# Patient Record
Sex: Male | Born: 1950 | Race: White | Hispanic: No | Marital: Single | Smoking: Current every day smoker
Health system: Southern US, Community
[De-identification: ages and names within clinical notes are randomized; demographics above are authoritative.]

## PROBLEM LIST (undated history)

## (undated) DIAGNOSIS — I10 Essential (primary) hypertension: Secondary | ICD-10-CM

## (undated) DIAGNOSIS — K219 Gastro-esophageal reflux disease without esophagitis: Secondary | ICD-10-CM

## (undated) DIAGNOSIS — Z9889 Other specified postprocedural states: Secondary | ICD-10-CM

## (undated) DIAGNOSIS — M1990 Unspecified osteoarthritis, unspecified site: Secondary | ICD-10-CM

## (undated) DIAGNOSIS — R112 Nausea with vomiting, unspecified: Secondary | ICD-10-CM

## (undated) HISTORY — PX: OTHER SURGICAL HISTORY: SHX169

## (undated) HISTORY — PX: JOINT REPLACEMENT: SHX530

---

## 2004-10-09 ENCOUNTER — Emergency Department: Payer: Self-pay | Admitting: Emergency Medicine

## 2004-11-06 ENCOUNTER — Encounter: Payer: Self-pay | Admitting: Physician Assistant

## 2004-11-08 ENCOUNTER — Ambulatory Visit: Payer: Self-pay | Admitting: Physician Assistant

## 2004-11-09 ENCOUNTER — Encounter: Payer: Self-pay | Admitting: Physician Assistant

## 2004-12-09 ENCOUNTER — Encounter: Payer: Self-pay | Admitting: Physician Assistant

## 2004-12-17 ENCOUNTER — Ambulatory Visit: Payer: Self-pay | Admitting: Orthopaedic Surgery

## 2005-01-09 ENCOUNTER — Encounter: Payer: Self-pay | Admitting: Physician Assistant

## 2005-02-08 ENCOUNTER — Encounter: Payer: Self-pay | Admitting: Physician Assistant

## 2005-03-11 ENCOUNTER — Encounter: Payer: Self-pay | Admitting: Physician Assistant

## 2005-03-25 ENCOUNTER — Ambulatory Visit: Payer: Self-pay | Admitting: Orthopaedic Surgery

## 2005-04-11 ENCOUNTER — Encounter: Payer: Self-pay | Admitting: Physician Assistant

## 2005-07-28 ENCOUNTER — Encounter: Payer: Self-pay | Admitting: Orthopedic Surgery

## 2005-08-11 ENCOUNTER — Encounter: Payer: Self-pay | Admitting: Orthopedic Surgery

## 2005-09-11 ENCOUNTER — Encounter: Payer: Self-pay | Admitting: Orthopedic Surgery

## 2005-10-09 ENCOUNTER — Encounter: Payer: Self-pay | Admitting: Orthopedic Surgery

## 2007-05-10 ENCOUNTER — Other Ambulatory Visit: Payer: Self-pay

## 2007-05-10 ENCOUNTER — Emergency Department: Payer: Self-pay | Admitting: Emergency Medicine

## 2007-09-11 ENCOUNTER — Emergency Department: Payer: Self-pay | Admitting: Emergency Medicine

## 2008-08-06 ENCOUNTER — Emergency Department: Payer: Self-pay | Admitting: Emergency Medicine

## 2008-10-11 ENCOUNTER — Emergency Department: Payer: Self-pay | Admitting: Internal Medicine

## 2009-09-04 ENCOUNTER — Emergency Department: Payer: Self-pay | Admitting: Emergency Medicine

## 2010-03-19 ENCOUNTER — Ambulatory Visit: Payer: Self-pay | Admitting: Specialist

## 2010-03-26 ENCOUNTER — Ambulatory Visit: Payer: Self-pay | Admitting: Specialist

## 2011-11-09 ENCOUNTER — Emergency Department: Payer: Self-pay | Admitting: Emergency Medicine

## 2011-11-09 LAB — BASIC METABOLIC PANEL
Anion Gap: 11 (ref 7–16)
BUN: 14 mg/dL (ref 7–18)
EGFR (Non-African Amer.): >60
Sodium: 139 mmol/L (ref 136–145)

## 2011-11-09 LAB — TROPONIN I: Troponin-I: <0.02 ng/mL

## 2011-11-09 LAB — CK TOTAL AND CKMB (NOT AT ARMC)
CK, Total: 246 U/L — ABNORMAL HIGH (ref 35–232)
CK-MB: 1.3 ng/mL (ref 0.5–3.6)

## 2011-11-09 LAB — CBC: MCH: 30.8 pg (ref 26.0–34.0)

## 2011-12-22 ENCOUNTER — Ambulatory Visit: Payer: Self-pay | Admitting: Specialist

## 2011-12-22 LAB — MRSA PCR SCREENING

## 2011-12-22 LAB — URINALYSIS, COMPLETE
Blood: NEGATIVE
Ketone: NEGATIVE
Leukocyte Esterase: NEGATIVE
Nitrite: NEGATIVE
Specific Gravity: 1.026 (ref 1.003–1.030)
Squamous Epithelial: NONE SEEN
WBC UR: 2 /HPF (ref 0–5)

## 2011-12-22 LAB — BASIC METABOLIC PANEL
Anion Gap: 8 (ref 7–16)
Co2: 27 mmol/L (ref 21–32)
Creatinine: 0.82 mg/dL (ref 0.60–1.30)
Osmolality: 285 (ref 275–301)

## 2011-12-22 LAB — CBC
HGB: 15.4 g/dL (ref 13.0–18.0)
MCHC: 34.4 g/dL (ref 32.0–36.0)
MCV: 90 fL (ref 80–100)
Platelet: 198 10*3/uL (ref 150–440)
RDW: 13.1 % (ref 11.5–14.5)

## 2011-12-31 ENCOUNTER — Inpatient Hospital Stay: Payer: Self-pay | Admitting: Specialist

## 2012-06-17 ENCOUNTER — Encounter (HOSPITAL_COMMUNITY): Payer: Self-pay

## 2012-06-21 ENCOUNTER — Encounter (HOSPITAL_COMMUNITY)
Admission: RE | Admit: 2012-06-21 | Discharge: 2012-06-21 | Disposition: A | Discharge status: Home / Self Care | Payer: Worker's Compensation | Source: Ambulatory Visit | Attending: Orthopedic Surgery | Admitting: Orthopedic Surgery

## 2012-06-21 ENCOUNTER — Encounter (HOSPITAL_COMMUNITY): Payer: Self-pay

## 2012-06-21 HISTORY — DX: Unspecified osteoarthritis, unspecified site: M19.90

## 2012-06-21 HISTORY — DX: Gastro-esophageal reflux disease without esophagitis: K21.9

## 2012-06-21 HISTORY — DX: Nausea with vomiting, unspecified: R11.2

## 2012-06-21 HISTORY — DX: Other specified postprocedural states: Z98.890

## 2012-06-21 LAB — CBC
HCT: 44.4 % (ref 39.0–52.0)
Platelets: 212 10*3/uL (ref 150–400)
RDW: 13.2 % (ref 11.5–15.5)
WBC: 7.3 10*3/uL (ref 4.0–10.5)

## 2012-06-21 LAB — SURGICAL PCR SCREEN
MRSA, PCR: NEGATIVE
Staphylococcus aureus: POSITIVE — AB

## 2012-06-21 NOTE — Patient Instructions (Signed)
20 Mathew Adams  06/21/2012   Your procedure is scheduled on: 06/24/12 130pm-230pm  Report to [facility] at 1100 AM.  Call this number if you have problems the morning of surgery: [phone]   Remember:   Do not eat food:After Midnight.  May have clear liquids:until 0700am then npo   Clear liquids include soda, tea, black coffee, apple or grape juice, broth.  Take these medicines the morning of surgery with A SIP OF WATER:    Do not wear jewelry,   Do not wear lotions, powders, or perfumes. .  . Men may shave face and neck.  Do not bring valuables to the hospital.  Contacts, dentures or bridgework may not be worn into surgery.  Leave suitcase in the car. After surgery it may be brought to your room.  For patients admitted to the hospital, checkout time is 11:00 AM the day of discharge.                SEE CHG INSTRUCTION SHEET    Please read over the following fact sheets that you were given: MRSA Information, coughing and deep breathing exericises, leg exercises

## 2012-06-24 ENCOUNTER — Encounter (HOSPITAL_COMMUNITY): Payer: Self-pay | Admitting: Anesthesiology

## 2012-06-24 ENCOUNTER — Encounter (HOSPITAL_COMMUNITY)
Admission: RE | Disposition: A | Discharge status: Home / Self Care | Payer: Self-pay | Source: Ambulatory Visit | Attending: Orthopedic Surgery

## 2012-06-24 ENCOUNTER — Encounter (HOSPITAL_COMMUNITY): Payer: Self-pay | Admitting: *Deleted

## 2012-06-24 ENCOUNTER — Observation Stay (HOSPITAL_COMMUNITY)
Admission: RE | Admit: 2012-06-24 | Discharge: 2012-06-25 | Disposition: A | Discharge status: Home / Self Care | Payer: Worker's Compensation | Source: Ambulatory Visit | Attending: Orthopedic Surgery | Admitting: Orthopedic Surgery

## 2012-06-24 ENCOUNTER — Ambulatory Visit (HOSPITAL_COMMUNITY): Payer: Worker's Compensation | Admitting: Anesthesiology

## 2012-06-24 DIAGNOSIS — F17200 Nicotine dependence, unspecified, uncomplicated: Secondary | ICD-10-CM | POA: Insufficient documentation

## 2012-06-24 DIAGNOSIS — M25569 Pain in unspecified knee: Secondary | ICD-10-CM | POA: Insufficient documentation

## 2012-06-24 DIAGNOSIS — Z9889 Other specified postprocedural states: Secondary | ICD-10-CM

## 2012-06-24 DIAGNOSIS — E663 Overweight: Secondary | ICD-10-CM

## 2012-06-24 DIAGNOSIS — Z01812 Encounter for preprocedural laboratory examination: Secondary | ICD-10-CM | POA: Insufficient documentation

## 2012-06-24 DIAGNOSIS — Z96659 Presence of unspecified artificial knee joint: Secondary | ICD-10-CM | POA: Insufficient documentation

## 2012-06-24 DIAGNOSIS — K219 Gastro-esophageal reflux disease without esophagitis: Secondary | ICD-10-CM | POA: Insufficient documentation

## 2012-06-24 DIAGNOSIS — M25669 Stiffness of unspecified knee, not elsewhere classified: Principal | ICD-10-CM | POA: Insufficient documentation

## 2012-06-24 HISTORY — PX: KNEE CLOSED REDUCTION: SHX995

## 2012-06-24 SURGERY — MANIPULATION, KNEE, CLOSED
Anesthesia: Regional | Site: Knee | Laterality: Right | Wound class: Clean

## 2012-06-24 MED ORDER — HYDROCODONE-ACETAMINOPHEN 5-325 MG PO TABS: 1.0000 | ORAL_TABLET | ORAL | Status: DC | PRN

## 2012-06-24 MED ORDER — FENTANYL CITRATE 0.05 MG/ML IJ SOLN
50.0000 ug | Freq: Once | INTRAMUSCULAR | Status: AC
  Administered 2012-06-24: 100 ug via INTRAVENOUS

## 2012-06-24 MED ORDER — PROMETHAZINE HCL 25 MG/ML IJ SOLN
12.5000 mg | INTRAMUSCULAR | Status: DC | PRN
  Administered 2012-06-24: 12.5 mg via INTRAVENOUS
  Filled 2012-06-24: qty 1

## 2012-06-24 MED ORDER — PROMETHAZINE HCL 25 MG/ML IJ SOLN: 6.2500 mg | INTRAMUSCULAR | Status: DC | PRN

## 2012-06-24 MED ORDER — PANTOPRAZOLE SODIUM 40 MG PO TBEC
40.0000 mg | DELAYED_RELEASE_TABLET | Freq: Every day | ORAL | Status: DC
  Administered 2012-06-25: 40 mg via ORAL
  Filled 2012-06-24: qty 1

## 2012-06-24 MED ORDER — SODIUM CHLORIDE 0.9 % IV SOLN
INTRAVENOUS | Status: DC
  Administered 2012-06-24 – 2012-06-25 (×2): via INTRAVENOUS
  Filled 2012-06-24 (×4): qty 1000

## 2012-06-24 MED ORDER — MIDAZOLAM HCL 2 MG/2ML IJ SOLN
INTRAMUSCULAR | Status: AC
  Filled 2012-06-24: qty 2

## 2012-06-24 MED ORDER — HYDROMORPHONE HCL PF 1 MG/ML IJ SOLN
0.5000 mg | INTRAMUSCULAR | Status: DC | PRN
  Administered 2012-06-24: 1 mg via INTRAVENOUS
  Filled 2012-06-24: qty 1

## 2012-06-24 MED ORDER — MEPERIDINE HCL 50 MG/ML IJ SOLN: 6.2500 mg | INTRAMUSCULAR | Status: DC | PRN

## 2012-06-24 MED ORDER — BISACODYL 10 MG RE SUPP: 10.0000 mg | Freq: Every day | RECTAL | Status: DC | PRN

## 2012-06-24 MED ORDER — ALUM & MAG HYDROXIDE-SIMETH 200-200-20 MG/5ML PO SUSP: 30.0000 mL | Freq: Four times a day (QID) | ORAL | Status: DC | PRN

## 2012-06-24 MED ORDER — CEFAZOLIN SODIUM-DEXTROSE 2-3 GM-% IV SOLR: 2.0000 g | INTRAVENOUS | Status: DC

## 2012-06-24 MED ORDER — LACTATED RINGERS IV SOLN
INTRAVENOUS | Status: DC
  Administered 2012-06-24: 1000 mL via INTRAVENOUS

## 2012-06-24 MED ORDER — POLYETHYLENE GLYCOL 3350 17 G PO PACK: 17.0000 g | PACK | Freq: Every day | ORAL | Status: DC | PRN

## 2012-06-24 MED ORDER — OXYCODONE HCL 5 MG PO TABS: 5.0000 mg | ORAL_TABLET | Freq: Once | ORAL | Status: DC | PRN

## 2012-06-24 MED ORDER — ACETAMINOPHEN 10 MG/ML IV SOLN
INTRAVENOUS | Status: DC | PRN
  Administered 2012-06-24: 1000 mg via INTRAVENOUS

## 2012-06-24 MED ORDER — PROMETHAZINE HCL 25 MG PO TABS: 25.0000 mg | ORAL_TABLET | Freq: Four times a day (QID) | ORAL | Status: DC | PRN

## 2012-06-24 MED ORDER — OXYCODONE HCL 5 MG/5ML PO SOLN
5.0000 mg | Freq: Once | ORAL | Status: DC | PRN
  Filled 2012-06-24: qty 5

## 2012-06-24 MED ORDER — MIDAZOLAM HCL 10 MG/2ML IJ SOLN
1.0000 mg | INTRAMUSCULAR | Status: DC | PRN
  Administered 2012-06-24: 2 mg via INTRAVENOUS

## 2012-06-24 MED ORDER — CELECOXIB 200 MG PO CAPS
200.0000 mg | ORAL_CAPSULE | Freq: Every day | ORAL | Status: DC
  Administered 2012-06-24 – 2012-06-25 (×2): 200 mg via ORAL
  Filled 2012-06-24 (×2): qty 1

## 2012-06-24 MED ORDER — METHOCARBAMOL 500 MG PO TABS: 500.0000 mg | ORAL_TABLET | Freq: Three times a day (TID) | ORAL | Status: DC | PRN

## 2012-06-24 MED ORDER — PROPOFOL 10 MG/ML IV BOLUS
INTRAVENOUS | Status: DC | PRN
  Administered 2012-06-24: 200 mg via INTRAVENOUS

## 2012-06-24 MED ORDER — METHOCARBAMOL 100 MG/ML IJ SOLN
500.0000 mg | Freq: Once | INTRAVENOUS | Status: AC
  Administered 2012-06-24: 500 mg via INTRAVENOUS
  Filled 2012-06-24 (×3): qty 5

## 2012-06-24 MED ORDER — FENTANYL CITRATE 0.05 MG/ML IJ SOLN
INTRAMUSCULAR | Status: AC
  Filled 2012-06-24: qty 2

## 2012-06-24 MED ORDER — DOCUSATE SODIUM 100 MG PO CAPS
100.0000 mg | ORAL_CAPSULE | Freq: Two times a day (BID) | ORAL | Status: DC
  Administered 2012-06-25: 100 mg via ORAL

## 2012-06-24 MED ORDER — ACETAMINOPHEN 10 MG/ML IV SOLN
INTRAVENOUS | Status: AC
  Filled 2012-06-24: qty 100

## 2012-06-24 MED ORDER — ROPIVACAINE HCL 5 MG/ML IJ SOLN
INTRAMUSCULAR | Status: AC
  Filled 2012-06-24: qty 30

## 2012-06-24 MED ORDER — ONDANSETRON HCL 4 MG PO TABS: 4.0000 mg | ORAL_TABLET | Freq: Four times a day (QID) | ORAL | Status: DC | PRN

## 2012-06-24 MED ORDER — INFLUENZA VIRUS VACC SPLIT PF IM SUSP
0.5000 mL | INTRAMUSCULAR | Status: AC
  Administered 2012-06-25: 0.5 mL via INTRAMUSCULAR
  Filled 2012-06-24: qty 0.5

## 2012-06-24 MED ORDER — ACETAMINOPHEN 10 MG/ML IV SOLN: 1000.0000 mg | Freq: Once | INTRAVENOUS | Status: DC | PRN

## 2012-06-24 MED ORDER — ZOLPIDEM TARTRATE 5 MG PO TABS: 5.0000 mg | ORAL_TABLET | Freq: Every evening | ORAL | Status: DC | PRN

## 2012-06-24 MED ORDER — HYDROMORPHONE HCL PF 1 MG/ML IJ SOLN
INTRAMUSCULAR | Status: AC
  Filled 2012-06-24: qty 1

## 2012-06-24 MED ORDER — ASPIRIN EC 325 MG PO TBEC
325.0000 mg | DELAYED_RELEASE_TABLET | Freq: Every day | ORAL | Status: DC
  Administered 2012-06-24 – 2012-06-25 (×2): 325 mg via ORAL
  Filled 2012-06-24 (×2): qty 1

## 2012-06-24 MED ORDER — ONDANSETRON HCL 4 MG/2ML IJ SOLN
4.0000 mg | Freq: Four times a day (QID) | INTRAMUSCULAR | Status: DC | PRN
  Administered 2012-06-24: 4 mg via INTRAVENOUS
  Filled 2012-06-24: qty 2

## 2012-06-24 MED ORDER — HYDROMORPHONE HCL PF 1 MG/ML IJ SOLN
0.2500 mg | INTRAMUSCULAR | Status: DC | PRN
  Administered 2012-06-24 (×4): 0.5 mg via INTRAVENOUS

## 2012-06-24 MED ORDER — PROMETHAZINE HCL 25 MG PO TABS: 25.0000 mg | ORAL_TABLET | ORAL | Status: DC | PRN

## 2012-06-24 MED ORDER — LIDOCAINE HCL (CARDIAC) 20 MG/ML IV SOLN
INTRAVENOUS | Status: DC | PRN
  Administered 2012-06-24: 50 mg via INTRAVENOUS

## 2012-06-24 SURGICAL SUPPLY — 19 items
BANDAGE ADHESIVE 1X3 (GAUZE/BANDAGES/DRESSINGS) IMPLANT
CLOTH BEACON ORANGE TIMEOUT ST (SAFETY) IMPLANT
DURAPREP 26ML APPLICATOR (WOUND CARE) IMPLANT
GLOVE BIOGEL PI IND STRL 7.5 (GLOVE) IMPLANT
GLOVE BIOGEL PI IND STRL 8 (GLOVE) IMPLANT
GLOVE BIOGEL PI INDICATOR 7.5 (GLOVE)
GLOVE BIOGEL PI INDICATOR 8 (GLOVE)
GLOVE ECLIPSE 8.0 STRL XLNG CF (GLOVE) IMPLANT
GLOVE ORTHO TXT STRL SZ7.5 (GLOVE) IMPLANT
GLOVE SURG ORTHO 8.0 STRL STRW (GLOVE) IMPLANT
GOWN BRE IMP PREV XXLGXLNG (GOWN DISPOSABLE) IMPLANT
GOWN STRL NON-REIN LRG LVL3 (GOWN DISPOSABLE) IMPLANT
MANIFOLD NEPTUNE II (INSTRUMENTS) IMPLANT
NDL SAFETY ECLIPSE 18X1.5 (NEEDLE) IMPLANT
NEEDLE HYPO 18GX1.5 SHARP (NEEDLE)
POSITIONER SURGICAL ARM (MISCELLANEOUS) IMPLANT
SPONGE GAUZE 4X4 12PLY (GAUZE/BANDAGES/DRESSINGS) IMPLANT
SYR CONTROL 10ML LL (SYRINGE) IMPLANT
TOWEL OR 17X26 10 PK STRL BLUE (TOWEL DISPOSABLE) IMPLANT

## 2012-06-24 NOTE — Addendum Note (Signed)
Addendum  created 06/24/12 1537 by Gaylan Gerold, MD   Modules edited:Charting, Inpatient Notes

## 2012-06-24 NOTE — H&P (Signed)
TOTAL KNEE ADMISSION H&P  Patient is being admitted for observation for right total knee manipulation under anestheisa  Subjective:  Chief Complaint:right knee pain about 6 months after right TKR.  HPI: Mathew Adams, 61 y.o. male, has a history of pain and functional disability in the right knee about 6 months after his total knee replacement with associated significant loss of motion.  He has failed therapy but has not had prior attempt at manipulation.  He has had persistent pain associated with this knee.  Work up for infection and clinical presentation were negative for infection.  After lengthy discussion in the office about next steps and options given his current situation I felt that at least an attempt at manipulation would be appropriate as other options include continued observation which he was not interested in versus revision TKR which I do not feel would be appropriate at this time due to significant pain and stiffness and early scar formation.  Patient currently rates pain in the right knee(s) at 7-8 out of 10 with activity. Patient has night pain, worsening of pain with activity and weight bearing, pain that interferes with activities of daily living and pain with passive range of motion.    There are no active problems to display for this patient.  Past Medical History  Diagnosis Date  . PONV (postoperative nausea and vomiting)   . GERD (gastroesophageal reflux disease)   . Arthritis     Past Surgical History  Procedure Date  . Left rotator cuff surgery    . Joint replacement     right knee replacement  . Left knee arthroscopy      Prescriptions prior to admission  Medication Sig Dispense Refill  . celecoxib (CELEBREX) 200 MG capsule Take 200 mg by mouth daily.      . lansoprazole (PREVACID) 30 MG capsule Take 30 mg by mouth daily.       No Known Allergies  History  Substance Use Topics  . Smoking status: Current Every Day Smoker -- 0.5 packs/day for 45 years  .  Smokeless tobacco: Never Used  . Alcohol Use: No    History reviewed. No pertinent family history.   Review of Systems  Constitutional: Negative for fever, chills and weight loss.  Eyes: Negative for blurred vision and double vision.  Respiratory: Negative for hemoptysis, sputum production, shortness of breath and wheezing.   Cardiovascular: Negative.  Negative for chest pain.  Gastrointestinal: Negative for nausea and vomiting.  Genitourinary: Negative for dysuria.  Musculoskeletal: Positive for joint pain.  Skin: Negative.   Neurological: Negative.   Psychiatric/Behavioral: Negative.     Objective:  Physical Exam  Constitutional: He appears well-developed and well-nourished.  Eyes: Pupils are equal, round, and reactive to light.  Neck: Normal range of motion. Neck supple.  Cardiovascular: Normal rate.   No murmur heard. Respiratory: Breath sounds normal.  GI: Soft.  Musculoskeletal: He exhibits tenderness.       Right knee: He exhibits decreased range of motion and bony tenderness. He exhibits no swelling, no effusion, no ecchymosis, no laceration and normal alignment. tenderness found.    Vital signs in last 24 hours: Temp:  [97 F (36.1 C)] 97 F (36.1 C) (11/14 0933) Pulse Rate:  [51] 51  (11/14 0933) Resp:  [18] 18  (11/14 0933) BP: (146)/(65) 146/65 mmHg (11/14 0933) SpO2:  [100 %] 100 % (11/14 0933)  Labs:   There is no height or weight on file to calculate BMI.   Imaging  Review X-rays from office revealed appropriately sized components and those that were well fixed, no signs of early periprosthetic complicating features   Assessment/Plan:  Persistently painful and stiff right TKR   Plan: After lengthy review in the office the plan at this point is to attempt a manipulation under anesthesia, admit for observation, start CPM here and continue at home +/- JAS splint for extension and return to outpatient therapy as soon as possible. Risks including  fracture, failure to improve motion persistent painful limited range of motion reviewed Consent obtained

## 2012-06-24 NOTE — Brief Op Note (Signed)
06/24/2012  12:44 PM  PATIENT:  Mathew Adams  61 y.o. male  PRE-OPERATIVE DIAGNOSIS:  right total knee arthrofibrosis, painful stiff Right TKR  POST-OPERATIVE DIAGNOSIS:  Right total knee arthrofibrosis  PROCEDURE:  Procedure(s) (LRB) with comments: CLOSED MANIPULATION KNEE (Right)  SURGEON:  Surgeon(s) and Role:    * Shelda Pal, MD - Primary  PHYSICIAN ASSISTANT: None   ANESTHESIA:   regional FNB and general  EBL:     BLOOD ADMINISTERED:none  DRAINS: none   LOCAL MEDICATIONS USED:  NONE  SPECIMEN:  No Specimen  DISPOSITION OF SPECIMEN:  N/A  COUNTS:  NO closed manipulation, not open procedure  TOURNIQUET:  * No tourniquets in log *  DICTATION: .Other Dictation: Dictation Number [id number]  PLAN OF CARE: Admit for overnight observation  PATIENT DISPOSITION:  PACU - hemodynamically stable.   Delay start of Pharmacological VTE agent (>24hrs) due to surgical blood loss or risk of bleeding: not applicable

## 2012-06-24 NOTE — Anesthesia Procedure Notes (Addendum)
Anesthesia Regional Block:  Femoral nerve block  Pre-Anesthetic Checklist: ,, timeout performed, Correct Patient, Correct Site, Correct Laterality, Correct Procedure, Correct Position, site marked, Risks and benefits discussed,  Surgical consent,  Pre-op evaluation,  At surgeon's request and post-op pain management  Laterality: Right  Prep: chloraprep       Needles:  Injection technique: Single-shot  Needle Type: Stimiplex      Needle Gauge: 18 and 18 G    Additional Needles:  Procedures: ultrasound guided (picture in chart) and nerve stimulator  Motor weakness within 10 minutes. Femoral nerve block  Nerve Stimulator or Paresthesia:  Response: Quad, 0.5 mA,   Additional Responses:   Narrative:  Start time: 06/24/2012 12:16 PM End time: 06/24/2012 12:25 PM Injection made incrementally with aspirations every 5 mL.  Performed by: Personally  Anesthesiologist: Renold Don

## 2012-06-24 NOTE — Addendum Note (Signed)
Addendum  created 06/24/12 1553 by Gaylan Gerold, MD   Modules edited:Charting, Inpatient Notes

## 2012-06-24 NOTE — Anesthesia Postprocedure Evaluation (Signed)
Anesthesia Post Note  Patient: Mathew Adams  Procedure(s) Performed: Procedure(s) (LRB): CLOSED MANIPULATION KNEE (Right)  Anesthesia type: General  Patient location: PACU  Post pain: Pain level controlled  Post assessment: Post-op Vital signs reviewed  Last Vitals: BP 157/70  Pulse 47  Temp 36.4 C (Oral)  Resp 18  SpO2 99%  Post vital signs: Reviewed  Level of consciousness: sedated  Complications: No apparent anesthesia complications

## 2012-06-24 NOTE — Anesthesia Preprocedure Evaluation (Addendum)
Anesthesia Evaluation  Patient identified by MRN, date of birth, ID band Patient awake    Reviewed: Allergy & Precautions, H&P , NPO status , Patient's Chart, lab work & pertinent test results  History of Anesthesia Complications (+) PONV  Airway Mallampati: I TM Distance: >3 FB Neck ROM: Full    Dental  (+) Dental Advisory Given, Edentulous Upper and Edentulous Lower   Pulmonary  breath sounds clear to auscultation  Pulmonary exam normal       Cardiovascular Rhythm:Regular Rate:Normal     Neuro/Psych    GI/Hepatic GERD-  ,  Endo/Other    Renal/GU      Musculoskeletal   Abdominal   Peds  Hematology   Anesthesia Other Findings   Reproductive/Obstetrics                          Anesthesia Physical Anesthesia Plan  ASA: II  Anesthesia Plan: General and Regional   Post-op Pain Management:    Induction: Intravenous  Airway Management Planned: Mask  Additional Equipment:   Intra-op Plan:   Post-operative Plan:   Informed Consent: I have reviewed the patients History and Physical, chart, labs and discussed the procedure including the risks, benefits and alternatives for the proposed anesthesia with the patient or authorized representative who has indicated his/her understanding and acceptance.   Dental advisory given  Plan Discussed with: CRNA  Anesthesia Plan Comments:        Anesthesia Quick Evaluation

## 2012-06-24 NOTE — Progress Notes (Signed)
Orthopedic Tech Progress Note Patient Details:  Mathew Adams 07-Mar-1951 [id number]  CPM Right Knee CPM Right Knee: On Right Knee Flexion (Degrees): 60  Right Knee Extension (Degrees): 0  Additional Comments: trapeze bar   Cammer, Mickie Bail 06/24/2012, 3:31 PM

## 2012-06-24 NOTE — Transfer of Care (Signed)
Immediate Anesthesia Transfer of Care Note  Patient: Mathew Adams  Procedure(s) Performed: Procedure(s) (LRB) with comments: CLOSED MANIPULATION KNEE (Right)  Patient Location: PACU  Anesthesia Type:General  Level of Consciousness: awake, alert  and oriented  Airway & Oxygen Therapy: Patient Spontanous Breathing and Patient connected to face mask oxygen  Post-op Assessment: Report given to PACU RN and Post -op Vital signs reviewed and stable  Post vital signs: Reviewed and stable  Complications: No apparent anesthesia complications 

## 2012-06-25 ENCOUNTER — Encounter (HOSPITAL_COMMUNITY): Payer: Self-pay | Admitting: Orthopedic Surgery

## 2012-06-25 DIAGNOSIS — Z9889 Other specified postprocedural states: Secondary | ICD-10-CM

## 2012-06-25 DIAGNOSIS — E663 Overweight: Secondary | ICD-10-CM

## 2012-06-25 MED ORDER — PROMETHAZINE HCL 25 MG PO TABS: 25.0000 mg | ORAL_TABLET | ORAL | Status: DC | PRN

## 2012-06-25 MED ORDER — POLYETHYLENE GLYCOL 3350 17 G PO PACK: 17.0000 g | PACK | Freq: Every day | ORAL | Status: DC | PRN

## 2012-06-25 MED ORDER — PROMETHAZINE HCL 25 MG/ML IJ SOLN: 12.5000 mg | INTRAMUSCULAR | Status: DC | PRN

## 2012-06-25 MED ORDER — ASPIRIN 325 MG PO TBEC: 325.0000 mg | DELAYED_RELEASE_TABLET | Freq: Every day | ORAL | Status: DC

## 2012-06-25 MED ORDER — HYDROCODONE-ACETAMINOPHEN 5-325 MG PO TABS: 1.0000 | ORAL_TABLET | ORAL | Status: DC | PRN

## 2012-06-25 MED ORDER — DSS 100 MG PO CAPS: 100.0000 mg | ORAL_CAPSULE | Freq: Two times a day (BID) | ORAL | Status: DC

## 2012-06-25 MED ORDER — METHOCARBAMOL 500 MG PO TABS: 500.0000 mg | ORAL_TABLET | Freq: Three times a day (TID) | ORAL | Status: DC | PRN

## 2012-06-25 NOTE — Progress Notes (Signed)
1310  Patient d/c to home, workers comp Sports coach to arrange hh pt.  Jas splint rep saw patient has info needed and will deliver splint to home. Patient has cpm at home set up and ready to use.  Sharrell Ku RN

## 2012-06-25 NOTE — Evaluation (Addendum)
Physical Therapy One Time Evaluation Patient Details Name: Mathew Adams MRN: [id number] DOB: 22-Nov-1950 Today's Date: 06/25/2012 Time: 4098-1191 PT Time Calculation (min): 24 min  PT Assessment / Plan / Recommendation Clinical Impression  Pt s/p R knee closed manipulation.  Pt seen for ambulation with RW and performing exercises to strengthen and improve ROM of R LE.  Pt doing well with RW and given handout for exercises.  Pt to receive HHPT to continue therapy and to d/c home today.    PT Assessment  All further PT needs can be met in the next venue of care    Follow Up Recommendations  Home health PT    Does the patient have the potential to tolerate intense rehabilitation      Barriers to Discharge        Equipment Recommendations  None recommended by PT    Recommendations for Other Services     Frequency      Precautions / Restrictions Precautions Precautions: Knee Required Braces or Orthoses: Knee Immobilizer - Right Knee Immobilizer - Right: Discontinue once straight leg raise with < 10 degree lag Restrictions RLE Weight Bearing: Weight bearing as tolerated   Pertinent Vitals/Pain 5/10 R knee pain after exercises, ice applied, pt declined notifying RN for meds      Mobility  Bed Mobility Bed Mobility: Supine to Sit;Sit to Supine Supine to Sit: 6: Modified independent (Device/Increase time) Sit to Supine: 6: Modified independent (Device/Increase time) Transfers Transfers: Sit to Stand;Stand to Sit Sit to Stand: 6: Modified independent (Device/Increase time) Stand to Sit: 6: Modified independent (Device/Increase time) Ambulation/Gait Ambulation/Gait Assistance: 6: Modified independent (Device/Increase time) Ambulation Distance (Feet): 120 Feet Assistive device: Rolling Gathers Ambulation/Gait Assistance Details: pt required verbal cues for safe RW distance but otherwise does well with RW Gait Pattern: Step-through pattern;Antalgic    Shoulder  Instructions     Exercises Total Joint Exercises Ankle Circles/Pumps: AROM;Both;20 reps Quad Sets: AROM;Right;20 reps Short Arc Quad: AROM;Right;20 reps Heel Slides: AAROM;Right;20 reps;Supine;Seated Hip ABduction/ADduction: AROM;Right;20 reps Straight Leg Raises: AAROM;Right;15 reps   PT Diagnosis: Abnormality of gait  PT Problem List: Decreased strength;Decreased range of motion;Pain PT Treatment Interventions:     PT Goals    Visit Information  Last PT Received On: 06/25/12 Assistance Needed: +1    Subjective Data  Subjective: I'd rather not take pain medicine if I can help it.   Prior Functioning  Home Living Lives With: Spouse Type of Home: House Home Access: Level entry Home Layout: One level Home Adaptive Equipment: Tapper - rolling Prior Function Level of Independence: Independent Communication Communication: No difficulties    Cognition  Overall Cognitive Status: Appears within functional limits for tasks assessed/performed Arousal/Alertness: Awake/alert Orientation Level: Appears intact for tasks assessed Behavior During Session: [facility] for tasks performed    Extremity/Trunk Assessment Right Upper Extremity Assessment RUE ROM/Strength/Tone: [facility] for tasks assessed Left Upper Extremity Assessment LUE ROM/Strength/Tone: WFL for tasks assessed Right Lower Extremity Assessment RLE ROM/Strength/Tone: Deficits RLE ROM/Strength/Tone Deficits: knee AAROM approx -10-55*, lag with SLR so KI applied Left Lower Extremity Assessment LLE ROM/Strength/Tone: WFL for tasks assessed   Balance    End of Session PT - End of Session Equipment Utilized During Treatment: Right knee immobilizer Activity Tolerance: Patient tolerated treatment well Patient left: in bed;with call bell/phone within reach;with family/visitor present  GP Functional Assessment Tool Used: clinical judgement Functional Limitation: Mobility: Walking and moving around Mobility: Walking and Moving Around  Current Status (Y7829): 0 percent impaired, limited or restricted  Mobility: Walking and Moving Around Goal Status [phone]): 0 percent impaired, limited or restricted Mobility: Walking and Moving Around Discharge Status [phone]): 0 percent impaired, limited or restricted   Amyrie Illingworth,KATHrine E 06/25/2012, 12:36 PM Pager: [phone]

## 2012-06-25 NOTE — Progress Notes (Signed)
Orthopedic Tech Progress Note Patient Details:  Mathew Adams [birth year] [id number]  Patient ID: Mathew Adams, male   DOB: 1951/06/06, 61 y.o.   MRN: [id number]   [name] 06/25/2012, 8:34 AM CALLED [employer] FOR RIGHT LEG JAZZ SPLINT.

## 2012-06-25 NOTE — Care Management Note (Signed)
    Page 1 of 2   06/25/2012     12:05:08 PM   CARE MANAGEMENT NOTE 06/25/2012  Patient:  Mathew Adams, Mathew Adams   Account Number:  [id number]  Date Initiated:  06/25/2012  Documentation initiated by:  Colleen Can  Subjective/Objective Assessment:   DX RT TOTAL KNEE ARTHROFIBROSIS: manipulation of rt knee under anesthesia    Worker's Comp-claim#[id number]  [place]  Adjuster-Gerald Grass-[phone]     Action/Plan:   CM spoke with patient and friend. Plans are that patient will return to [facility] where he will have caregiver. States he alrewady has cane and [place]. States CPM was delivered to his home prior to his hosp admission   Anticipated DC Date:  06/25/2012   Anticipated DC Plan:  HOME W HOME HEALTH SERVICES  In-house referral  NA      DC Planning Services  CM consult      [facility] Choice  HOME HEALTH   Choice offered to / List presented to:  C-1 Patient   DME arranged  NA      DME agency  NA     HH arranged  HH-2 PT      HH agency  OTHER - SEE NOTE   Status of service:  In process, will continue to follow Medicare Important Message given?   (If response is "NO", the following Medicare IM given date fields will be blank) Date Medicare IM given:   Date Additional Medicare IM given:    Discharge Disposition:    Per UR Regulation:  Reviewed for med. necessity/level of care/duration of stay  If discussed at Long Length of Stay Meetings, dates discussed:    Comments:  06/25/2012 Colleen Can BSN RN CCM [phone]  [employer] worker's comp case manager-Sandra-[phone]; she requested that [facility] orders, face sheet, op note, h&p, discharge instructions be faxed to [phone]. Info faxed with confirmation. Worker's comp Sports coach states she will advise patient what agency will be providing HHPT when it goes through agency that sets up hh services.

## 2012-06-25 NOTE — Op Note (Signed)
NAMEAWAIS, COBARRUBIAS NO.:  [id number]  MEDICAL RECORD NO.:  [id number]  LOCATION:  1619                         FACILITY:  [facility]  PHYSICIAN:  Madlyn Frankel. Charlann Boxer, M.D.  DATE OF BIRTH:  1951-02-13  DATE OF PROCEDURE:  06/24/2012 DATE OF DISCHARGE:                              OPERATIVE REPORT   PREOPERATIVE DIAGNOSIS:  Status post right total knee replacement with persistent pain and stiffness consistent with arthrofibrosis.  POSTOPERATIVE DIAGNOSIS:  Status post right total knee replacement with persistent pain and stiffness consistent with arthrofibrosis.  FINDINGS:  The patient's preoperative range of motion was lacking 5-10 degrees of full extension and flexion only to about 60 degrees.  PROCEDURE:  Closed manipulation of right knee under anesthesia.  ANESTHESIA:  Preoperative regional femoral nerve block plus general anesthetic.  SPECIMENS:  None.  COMPLICATIONS:  None.  INDICATIONS FOR PROCEDURE:  Mr. Imel is a 61 year old gentleman with a history of a total knee replacement about 6 months ago.  He presented to the office for a second opinion evaluation with persistent pain and stiffness.  I had a lengthy discussion about options available to try to manage his knee and did not feel that an open procedure was indicated based on the time away from surgery and his persistent scarring.  I had suggested a manipulation under anesthesia with the attempt to try to salvage the joint without having to open them up.  Despite the fact that it was close to 6 months out in a limited success and literature for manipulations this far out.  We discussed the risks of recurrence of pain, stiffness, the postoperative course and expectations and the risk of fracture or other complicating features.  Consent was obtained for benefit of motion and pain relief.  PROCEDURE IN DETAIL:  The patient was brought to the operative theater. Once adequate anesthesia was  established, he was positioned on the stretcher, a time-out was performed identifying the patient, planned procedure, and extremity.  The examination under anesthesia initially found this range of motion we lacked 5 to 10 degrees of full extension in it and with his hip flexed only flex passively to about 60 degrees.  Initially, I tried to held his leg out to extension.  It did not get a significant amount of motion with his knee in extension.  I then flexed his hip up and then applied pressure across the proximal tibia and with this found was able to lyse significant amount of adhesions.  A of a constant progressive flexion stress across proximal tibia.  I was able to get him flexed to at least 110 fairly easily and with pressure to 120.  I then brought his leg out to extension again and with his foot on to my shoulder try to get little down with pressure and maybe got a little bit of stretching of the posterior structures to about 5 degrees.  This represented a significant improvement in his range of motion again from a motion of 10 to 60 degree arc to a 5 to 110 to 120 degree arc.  Following these procedures the case concluded.  The patient was then extubated and brought to the recovery room  in stable condition.  He will stay overnight to be placed on a CPM.  We will arrange for home CPM and home JAS splint to work on extension and flexion, as well as immediate physical therapy outpatient.     Madlyn Frankel Charlann Boxer, M.D.     MDO/MEDQ  D:  06/24/2012  T:  06/25/2012  Job:  [id number]

## 2012-06-25 NOTE — Progress Notes (Signed)
Orthopedic Tech Progress Note Patient Details:  Mathew Adams [birth year] [id number]  Ortho Devices Type of Ortho Device: Knee Immobilizer Ortho Device/Splint Location: RIGHT KNEE IMMOBILIZER Ortho Device/Splint Interventions: Application   [name] 06/25/2012, 9:20 AM

## 2012-06-25 NOTE — Progress Notes (Signed)
   Subjective: 1 Day Post-Op Procedure(s) (LRB): CLOSED MANIPULATION KNEE (Right)   Patient reports pain as mild, pain well controlled. States that he is still unable to do a straight leg raise. Ready to be discharged home.  Objective:   VITALS:   Filed Vitals:   06/25/12 0517  BP: 131/69  Pulse: 57  Temp: 97.8 F (36.6 C)  Resp: 14    Dorsiflexion/Plantar flexion intact No cellulitis present Compartment soft  LABS No new labs   Assessment/Plan: 1 Day Post-Op Procedure(s) (LRB): CLOSED MANIPULATION KNEE (Right) Maintain knee immobilizer until he can do a straight leg raise PT x 1 ordered to go over exercises and ambulation  Up with therapy Discharge home with home health Follow up in 2 weeks at [facility]. Follow up with OLIN,William Schake D in 2 weeks.  Contact information:  [facility] [address] [place] [address] [phone]     Overweight (BMI 25-29.9) Estimated Body mass index is 29.42 kg/(m^2) as calculated from the following:   Height as of this encounter: 6\' 1" (1.854 m).   Weight as of this encounter: 223 lb(101.152 kg). Patient also counseled that weight may inhibit the healing process Patient counseled that losing weight will help with future health issues       Anastasio Auerbach. Araceli Coufal   PAC  06/25/2012, 8:51 AM

## 2012-06-30 NOTE — Discharge Summary (Signed)
Physician Discharge Summary  Patient ID: Mathew Adams MRN: [id number] DOB/AGE: August 23, 1950 61 y.o.  Admit date: 06/24/2012 Discharge date: 06/25/2012   Procedures:  Procedure(s) (LRB): CLOSED MANIPULATION KNEE (Right)  Attending Physician:  Dr. Durene Romans   Admission Diagnoses:   Right knee pain about 6 months after right TKR  Discharge Diagnoses:  Principal Problem:  *S/P right knee manipulation Active Problems:  Overweight (BMI 25.0-29.9) PONV (postoperative nausea and vomiting)   GERD (gastroesophageal reflux disease)   Arthritis   HPI:  Mathew Adams, 61 y.o. male, has a history of pain and functional disability in the right knee about 6 months after his total knee replacement with associated significant loss of motion. He has failed therapy but has not had prior attempt at manipulation. He has had persistent pain associated with this knee. Work up for infection and clinical presentation were negative for infection. After lengthy discussion in the office about next steps and options given his current situation I felt that at least an attempt at manipulation would be appropriate as other options include continued observation which he was not interested in versus revision TKR which I do not feel would be appropriate at this time due to significant pain and stiffness and early scar formation. Patient currently rates pain in the right knee(s) at 7-8 out of 10 with activity. Patient has night pain, worsening of pain with activity and weight bearing, pain that interferes with activities of daily living and pain with passive range of motion.   PCP: No primary provider on file.   Discharged Condition: good  Hospital Course:  Patient underwent the above stated procedure on 06/24/2012. Patient tolerated the procedure well and brought to the recovery room in good condition and subsequently to the floor.  POD #1 BP: 131/69 ; Pulse: 57 ; Temp: 97.8 F (36.6 C) ; Resp: 14 Pt's  foley was removed, as well as the hemovac drain removed. IV was changed to a saline lock. Patient reports pain as mild, pain well controlled. States that he is still unable to do a straight leg raise. Ready to be discharged home. Neurovascular intact, dorsiflexion/plantar flexion intact, incision: dressing C/D/I, no cellulitis present and compartment soft.   LABS   No labs  Discharge Exam: General appearance: alert, cooperative and no distress Extremities: Homans sign is negative, no sign of DVT, no edema, redness or tenderness in the calves or thighs and no ulcers, gangrene or trophic changes  Disposition:  Home or Self Care with follow up in 2 weeks   Follow-up Information    Follow up with Shelda Pal, MD. Schedule an appointment as soon as possible for a visit in 2 weeks.   Contact information:   [address] [place] [address] [phone]          Discharge Orders    Future Orders Please Complete By Expires   Diet - low sodium heart healthy      Call MD / Call 911      Comments:   If you experience chest pain or shortness of breath, CALL 911 and be transported to the hospital emergency room.  If you develope a fever above 101 F, pus (white drainage) or increased drainage or redness at the wound, or calf pain, call your surgeon's office.   Discharge instructions      Comments:   Follow up in 2 weeks at [facility]. Call with any questions or concerns.  CPM machine 4-6 hours a day,  0-60 degrees, increasing 10 degrees daily as tolerated.   Constipation Prevention      Comments:   Drink plenty of fluids.  Prune juice may be helpful.  You may use a stool softener, such as Colace (over the counter) 100 mg twice a day.  Use MiraLax (over the counter) for constipation as needed.   Increase activity slowly as tolerated         Discharge Medication List as of 06/25/2012 12:45 PM    START taking these medications   Details  aspirin EC 325 MG EC  tablet Take 1 tablet (325 mg total) by mouth daily., Starting 06/25/2012, Until Discontinued, No Print    docusate sodium 100 MG CAPS Take 100 mg by mouth 2 (two) times daily., Starting 06/25/2012, Until Discontinued, No Print    HYDROcodone-acetaminophen (NORCO/VICODIN) 5-325 MG per tablet Take 1-2 tablets by mouth every 4 (four) hours as needed for pain., Starting 06/25/2012, Until Discontinued, Print    methocarbamol (ROBAXIN) 500 MG tablet Take 1 tablet (500 mg total) by mouth every 8 (eight) hours as needed (muscle spasms)., Starting 06/25/2012, Until Discontinued, Print    polyethylene glycol (MIRALAX / GLYCOLAX) packet Take 17 g by mouth daily as needed., Starting 06/25/2012, Until Discontinued, No Print      CONTINUE these medications which have NOT CHANGED   Details  celecoxib (CELEBREX) 200 MG capsule Take 200 mg by mouth daily., Until Discontinued, Historical Med    lansoprazole (PREVACID) 30 MG capsule Take 30 mg by mouth daily., Until Discontinued, Historical Med         Signed: [name]. Mathew Adams   PAC  06/30/2012, 2:39 PM

## 2012-08-31 ENCOUNTER — Encounter (HOSPITAL_COMMUNITY): Payer: Self-pay | Admitting: Pharmacy Technician

## 2012-08-31 NOTE — Progress Notes (Signed)
Preop appointment for surgery on 1/24 at 1000am Need orders in EPIC.  Thanks.

## 2012-09-02 ENCOUNTER — Other Ambulatory Visit (HOSPITAL_COMMUNITY): Payer: Self-pay | Admitting: Orthopedic Surgery

## 2012-09-02 NOTE — Patient Instructions (Addendum)
20 VRISHANK MOSTER  09/02/2012   Your procedure is scheduled on: 09-13-2012  Report to [facility] at 1150 pm  Call this number if you have problems the morning of surgery [phone]   Remember:   Do not eat food :After Midnight.  Clear liquids midnight until 0820 day of surgery, then nothing by mouth   Take these medicines the morning of surgery with A SIP OF WATER: HYDROCODONE IF NEEDED, PREVACID                             SEE [place] PREPARING FOR SURGERY SHEET   Do not wear jewelry, make-up or nail polish.  Do not wear lotions, powders, or perfumes. You may wear deodorant.   Men may shave face and neck.  Do not bring valuables to the hospital.  Contacts, dentures or bridgework may not be worn into surgery.  Leave suitcase in the car. After surgery it may be brought to your room.  For patients admitted to the hospital, checkout time is 11:00 AM the day of discharge.   Patients discharged the day of surgery will not be allowed to drive home.  Name and phone number of your driver:  Special Instructions: N/A   Please read over the following fact sheets that you were given: MRSA Information.  Call Cain Sieve RN pre op nurse if needed [phone]    FAILURE TO FOLLOW THESE INSTRUCTIONS MAY RESULT IN THE CANCELLATION OF YOUR SURGERY. PATIENT SIGNATURE___________________________________________

## 2012-09-03 ENCOUNTER — Encounter (HOSPITAL_COMMUNITY)
Admission: RE | Admit: 2012-09-03 | Discharge: 2012-09-03 | Disposition: A | Discharge status: Home / Self Care | Payer: Worker's Compensation | Source: Ambulatory Visit | Attending: Orthopedic Surgery | Admitting: Orthopedic Surgery

## 2012-09-03 ENCOUNTER — Encounter (HOSPITAL_COMMUNITY): Payer: Self-pay

## 2012-09-03 LAB — CBC
Hemoglobin: 15.3 g/dL (ref 13.0–17.0)
MCH: 30.8 pg (ref 26.0–34.0)
MCHC: 34.6 g/dL (ref 30.0–36.0)
MCV: 88.9 fL (ref 78.0–100.0)
Platelets: 234 10*3/uL (ref 150–400)
RBC: 4.97 MIL/uL (ref 4.22–5.81)
RDW: 12.8 % (ref 11.5–15.5)

## 2012-09-03 LAB — SURGICAL PCR SCREEN: Staphylococcus aureus: NEGATIVE

## 2012-09-07 NOTE — Progress Notes (Signed)
Spoke with tp by phone aware surgery time changed npo after midnight arrive 0925 am 09-13-12 [facility] short stay surgery time is 1225.

## 2012-09-12 MED ORDER — CEFAZOLIN SODIUM-DEXTROSE 2-3 GM-% IV SOLR: 2.0000 g | INTRAVENOUS | Status: DC

## 2012-09-13 ENCOUNTER — Encounter (HOSPITAL_COMMUNITY): Payer: Self-pay | Admitting: *Deleted

## 2012-09-13 ENCOUNTER — Encounter (HOSPITAL_COMMUNITY): Payer: Self-pay | Admitting: Anesthesiology

## 2012-09-13 ENCOUNTER — Encounter (HOSPITAL_COMMUNITY)
Admission: RE | Disposition: A | Discharge status: Home / Self Care | Payer: Self-pay | Source: Ambulatory Visit | Attending: Orthopedic Surgery

## 2012-09-13 ENCOUNTER — Ambulatory Visit (HOSPITAL_COMMUNITY): Payer: Worker's Compensation | Admitting: Anesthesiology

## 2012-09-13 ENCOUNTER — Ambulatory Visit (HOSPITAL_COMMUNITY)
Admission: RE | Admit: 2012-09-13 | Discharge: 2012-09-13 | Disposition: A | Discharge status: Home / Self Care | Payer: Worker's Compensation | Source: Ambulatory Visit | Attending: Orthopedic Surgery | Admitting: Orthopedic Surgery

## 2012-09-13 DIAGNOSIS — M25569 Pain in unspecified knee: Secondary | ICD-10-CM | POA: Insufficient documentation

## 2012-09-13 DIAGNOSIS — Z9889 Other specified postprocedural states: Secondary | ICD-10-CM

## 2012-09-13 DIAGNOSIS — Z7982 Long term (current) use of aspirin: Secondary | ICD-10-CM | POA: Insufficient documentation

## 2012-09-13 DIAGNOSIS — T8489XA Other specified complication of internal orthopedic prosthetic devices, implants and grafts, initial encounter: Secondary | ICD-10-CM | POA: Insufficient documentation

## 2012-09-13 DIAGNOSIS — F17200 Nicotine dependence, unspecified, uncomplicated: Secondary | ICD-10-CM | POA: Insufficient documentation

## 2012-09-13 DIAGNOSIS — Z79899 Other long term (current) drug therapy: Secondary | ICD-10-CM | POA: Insufficient documentation

## 2012-09-13 DIAGNOSIS — Z96659 Presence of unspecified artificial knee joint: Secondary | ICD-10-CM | POA: Insufficient documentation

## 2012-09-13 DIAGNOSIS — Z01812 Encounter for preprocedural laboratory examination: Secondary | ICD-10-CM | POA: Insufficient documentation

## 2012-09-13 DIAGNOSIS — K219 Gastro-esophageal reflux disease without esophagitis: Secondary | ICD-10-CM | POA: Insufficient documentation

## 2012-09-13 DIAGNOSIS — Y831 Surgical operation with implant of artificial internal device as the cause of abnormal reaction of the patient, or of later complication, without mention of misadventure at the time of the procedure: Secondary | ICD-10-CM | POA: Insufficient documentation

## 2012-09-13 HISTORY — PX: KNEE CLOSED REDUCTION: SHX995

## 2012-09-13 SURGERY — MANIPULATION, KNEE, CLOSED
Anesthesia: General | Site: Knee | Laterality: Right | Wound class: Clean

## 2012-09-13 MED ORDER — HYDROMORPHONE HCL PF 1 MG/ML IJ SOLN
INTRAMUSCULAR | Status: AC
  Filled 2012-09-13: qty 1

## 2012-09-13 MED ORDER — PROPOFOL 10 MG/ML IV BOLUS
INTRAVENOUS | Status: DC | PRN
  Administered 2012-09-13: 250 mg via INTRAVENOUS

## 2012-09-13 MED ORDER — ACETAMINOPHEN 10 MG/ML IV SOLN
INTRAVENOUS | Status: AC
  Filled 2012-09-13: qty 100

## 2012-09-13 MED ORDER — FENTANYL CITRATE 0.05 MG/ML IJ SOLN
INTRAMUSCULAR | Status: AC
  Filled 2012-09-13: qty 2

## 2012-09-13 MED ORDER — HYDROMORPHONE HCL PF 1 MG/ML IJ SOLN
0.2500 mg | INTRAMUSCULAR | Status: DC | PRN
  Administered 2012-09-13 (×2): 0.5 mg via INTRAVENOUS

## 2012-09-13 MED ORDER — ROPIVACAINE HCL 5 MG/ML IJ SOLN
INTRAMUSCULAR | Status: AC
  Filled 2012-09-13: qty 30

## 2012-09-13 MED ORDER — ACETAMINOPHEN 10 MG/ML IV SOLN
INTRAVENOUS | Status: DC | PRN
  Administered 2012-09-13: 1000 mg via INTRAVENOUS

## 2012-09-13 MED ORDER — LIDOCAINE HCL (CARDIAC) 20 MG/ML IV SOLN
INTRAVENOUS | Status: DC | PRN
  Administered 2012-09-13: 100 mg via INTRAVENOUS

## 2012-09-13 MED ORDER — HYDROCODONE-ACETAMINOPHEN 7.5-325 MG PO TABS: 1.0000 | ORAL_TABLET | ORAL | Status: DC | PRN

## 2012-09-13 MED ORDER — METHOCARBAMOL 100 MG/ML IJ SOLN
500.0000 mg | Freq: Once | INTRAVENOUS | Status: AC
  Administered 2012-09-13: 500 mg via INTRAVENOUS
  Filled 2012-09-13: qty 5

## 2012-09-13 MED ORDER — ACETAMINOPHEN 10 MG/ML IV SOLN: 1000.0000 mg | Freq: Once | INTRAVENOUS | Status: DC | PRN

## 2012-09-13 MED ORDER — ONDANSETRON HCL 4 MG/2ML IJ SOLN
INTRAMUSCULAR | Status: AC
  Administered 2012-09-13: 4 mg
  Filled 2012-09-13: qty 2

## 2012-09-13 MED ORDER — LACTATED RINGERS IV SOLN
INTRAVENOUS | Status: DC
  Administered 2012-09-13: 1000 mL via INTRAVENOUS
  Administered 2012-09-13: 14:00:00 via INTRAVENOUS

## 2012-09-13 MED ORDER — PROMETHAZINE HCL 25 MG/ML IJ SOLN: 6.2500 mg | INTRAMUSCULAR | Status: DC | PRN

## 2012-09-13 MED ORDER — ROPIVACAINE HCL 5 MG/ML IJ SOLN
INTRAMUSCULAR | Status: DC | PRN
  Administered 2012-09-13: 30 mL

## 2012-09-13 MED ORDER — OXYCODONE HCL 5 MG/5ML PO SOLN
5.0000 mg | Freq: Once | ORAL | Status: DC | PRN
  Filled 2012-09-13: qty 5

## 2012-09-13 MED ORDER — MIDAZOLAM HCL 2 MG/2ML IJ SOLN
INTRAMUSCULAR | Status: AC
  Filled 2012-09-13: qty 2

## 2012-09-13 MED ORDER — OXYCODONE HCL 5 MG PO TABS: 5.0000 mg | ORAL_TABLET | Freq: Once | ORAL | Status: DC | PRN

## 2012-09-13 MED ORDER — FENTANYL CITRATE 0.05 MG/ML IJ SOLN
50.0000 ug | Freq: Once | INTRAMUSCULAR | Status: AC
  Administered 2012-09-13: 100 ug via INTRAVENOUS

## 2012-09-13 MED ORDER — MEPERIDINE HCL 50 MG/ML IJ SOLN: 6.2500 mg | INTRAMUSCULAR | Status: DC | PRN

## 2012-09-13 MED ORDER — FENTANYL CITRATE 0.05 MG/ML IJ SOLN
25.0000 ug | INTRAMUSCULAR | Status: DC | PRN
  Administered 2012-09-13 (×2): 50 ug via INTRAVENOUS

## 2012-09-13 MED ORDER — MIDAZOLAM HCL 10 MG/2ML IJ SOLN
1.0000 mg | INTRAMUSCULAR | Status: DC | PRN
  Administered 2012-09-13: 2 mg via INTRAVENOUS

## 2012-09-13 SURGICAL SUPPLY — 21 items
BANDAGE ADHESIVE 1X3 (GAUZE/BANDAGES/DRESSINGS) IMPLANT
CLOTH BEACON ORANGE TIMEOUT ST (SAFETY) ×2 IMPLANT
DURAPREP 26ML APPLICATOR (WOUND CARE) IMPLANT
GLOVE BIOGEL PI IND STRL 7.5 (GLOVE) ×1 IMPLANT
GLOVE BIOGEL PI IND STRL 8 (GLOVE) ×1 IMPLANT
GLOVE BIOGEL PI INDICATOR 7.5 (GLOVE) ×1
GLOVE BIOGEL PI INDICATOR 8 (GLOVE) ×1
GLOVE ECLIPSE 8.0 STRL XLNG CF (GLOVE) IMPLANT
GLOVE ORTHO TXT STRL SZ7.5 (GLOVE) IMPLANT
GLOVE SURG ORTHO 8.0 STRL STRW (GLOVE) ×2 IMPLANT
GOWN BRE IMP PREV XXLGXLNG (GOWN DISPOSABLE) IMPLANT
GOWN STRL NON-REIN LRG LVL3 (GOWN DISPOSABLE) ×2 IMPLANT
IMMOBILIZER KNEE 20 (SOFTGOODS) ×2
IMMOBILIZER KNEE 20 THIGH 36 (SOFTGOODS) ×1 IMPLANT
MANIFOLD NEPTUNE II (INSTRUMENTS) IMPLANT
NDL SAFETY ECLIPSE 18X1.5 (NEEDLE) IMPLANT
NEEDLE HYPO 18GX1.5 SHARP (NEEDLE)
POSITIONER SURGICAL ARM (MISCELLANEOUS) IMPLANT
SPONGE GAUZE 4X4 12PLY (GAUZE/BANDAGES/DRESSINGS) IMPLANT
SYR CONTROL 10ML LL (SYRINGE) IMPLANT
TOWEL OR 17X26 10 PK STRL BLUE (TOWEL DISPOSABLE) IMPLANT

## 2012-09-13 NOTE — H&P (Signed)
  Mathew Adams is an 62 y.o. male.    Chief Complaint:  Stiff and painful right total knee repalcement   HPI: Pt is a 62 y.o. male with history of right total knee replacement in May 2012.  He was seen as a second opinion for a stiff and painful right total knee. He has already undergone one manipulation but presented at 8 weeks with recurrence of his pain and stiffness despite great results with the manipulation.    PCP:  No primary provider on file.  D/C Plans: To be determined following appropriate treatment plan  PMH: Past Medical History  Diagnosis Date  . GERD (gastroesophageal reflux disease)   . Arthritis   . PONV (postoperative nausea and vomiting)     PSH: Past Surgical History  Procedure Date  . Left rotator cuff surgery    . Left knee arthroscopy    . Knee closed reduction 06/24/2012    Procedure: CLOSED MANIPULATION KNEE;  Surgeon: Shelda Pal, MD;  Location: [facility];  Service: Orthopedics;  Laterality: Right;  . Joint replacement     right knee replacement    Social History:  reports that he has been smoking Cigarettes.  He has a 22.5 pack-year smoking history. He has never used smokeless tobacco. He reports that he does not drink alcohol or use illicit drugs.  Allergies:  No Known Allergies  Medications: Medications Prior to Admission  Medication Sig Dispense Refill  . aspirin EC 325 MG EC tablet Take 1 tablet (325 mg total) by mouth daily.  30 tablet  0  . celecoxib (CELEBREX) 200 MG capsule Take 200 mg by mouth daily.      Marland Kitchen docusate sodium (COLACE) 100 MG capsule Take 100 mg by mouth daily.      Marland Kitchen HYDROcodone-acetaminophen (NORCO/VICODIN) 5-325 MG per tablet Take 1-2 tablets by mouth every 4 (four) hours as needed for pain.  120 tablet  0  . lansoprazole (PREVACID) 30 MG capsule Take 30 mg by mouth every morning.         No results found for this or any previous visit (from the past 48 hour(s)). No results found.  ROS: Review of Systems - General  ROS: negative for - chills, fatigue or fever Psychological ROS: negative for - memory difficulties Respiratory ROS: no cough, shortness of breath, or wheezing Cardiovascular ROS: no chest pain or dyspnea on exertion Gastrointestinal ROS: no abdominal pain, change in bowel habits, or black or bloody stools Genito-Urinary ROS: no dysuria, trouble voiding, or hematuria Musculoskeletal ROS: positive for - joint pain and joint stiffness Neurological ROS: no TIA or stroke symptoms  Physican Exam: Blood pressure 131/76, pulse 111, temperature 97.1 F (36.2 C), resp. rate 20, SpO2 97.00%. .physicalexam  Awake alert Painful right knee with flexion No erythema No swelling Decreased ROM right knee 10-80  Chest clear Heart regular rate Abdomen soft nontender  Assessment/Plan Assessment: Painful stiff right total knee   Plan: Patient will undergo a repeat manipulation under anesthesia in attempt to enhance his result.  Reviewed extensively my thoughts on this in clinic.  Will be discharged home from PACU.  He will resume therapy and use of CPM at home. Risks benefits and expectation were discussed with the patient particularly the risk of persistent stiffness and pain. Patient understand risks, benefits and expectation and wishes to proceed.   Madlyn Frankel Charlann Boxer, MD  09/13/2012, 12:10 PM

## 2012-09-13 NOTE — Anesthesia Preprocedure Evaluation (Addendum)
Anesthesia Evaluation  Patient identified by MRN, date of birth, ID band Patient awake    Reviewed: Allergy & Precautions, H&P , NPO status , Patient's Chart, lab work & pertinent test results  History of Anesthesia Complications (+) PONV  Airway Mallampati: I TM Distance: >3 FB Neck ROM: Full    Dental  (+) Dental Advisory Given, Edentulous Upper and Edentulous Lower   Pulmonary Current Smoker,  breath sounds clear to auscultation  Pulmonary exam normal       Cardiovascular + Peripheral Vascular Disease negative cardio ROS  Rhythm:Regular Rate:Normal     Neuro/Psych negative neurological ROS  negative psych ROS   GI/Hepatic Neg liver ROS, GERD-  Medicated,  Endo/Other  negative endocrine ROS  Renal/GU negative Renal ROS     Musculoskeletal negative musculoskeletal ROS (+)   Abdominal   Peds  Hematology negative hematology ROS (+)   Anesthesia Other Findings   Reproductive/Obstetrics                          Anesthesia Physical Anesthesia Plan  ASA: II  Anesthesia Plan: General   Post-op Pain Management: MAC Combined w/ Regional for Post-op pain   Induction:   Airway Management Planned: LMA  Additional Equipment:   Intra-op Plan:   Post-operative Plan: Extubation in OR  Informed Consent: I have reviewed the patients History and Physical, chart, labs and discussed the procedure including the risks, benefits and alternatives for the proposed anesthesia with the patient or authorized representative who has indicated his/her understanding and acceptance.   Dental advisory given  Plan Discussed with: CRNA  Anesthesia Plan Comments:        Anesthesia Quick Evaluation

## 2012-09-13 NOTE — Preoperative (Signed)
Beta Blockers   Reason not to administer Beta Blockers:Not Applicable 

## 2012-09-13 NOTE — Brief Op Note (Signed)
09/13/2012  12:33 PM  PATIENT:  Mathew Adams  62 y.o. male  PRE-OPERATIVE DIAGNOSIS:  RECURRENT PAINFUL STIFF RIGHT TOTAL KNEE ARTHROPLASTY   POST-OPERATIVE DIAGNOSIS:  recurrent stiff painful right total knee repalcement  PROCEDURE:  Procedure(s) (LRB) with comments: CLOSED MANIPULATION KNEE (Right)  SURGEON:  Surgeon(s) and Role:    * Shelda Pal, MD - Primary  PHYSICIAN ASSISTANT: None  ANESTHESIA:   regional and general  EBL:     BLOOD ADMINISTERED:none  DRAINS: none   LOCAL MEDICATIONS USED:  NONE  SPECIMEN:  No Specimen  DISPOSITION OF SPECIMEN:  N/A  COUNTS:  NO Not required as this was a closed case  TOURNIQUET:  * No tourniquets in log *  DICTATION: .Other Dictation: Dictation Number [id number]  PLAN OF CARE: Discharge to home after PACU  PATIENT DISPOSITION:  PACU - hemodynamically stable.   Delay start of Pharmacological VTE agent (>24hrs) due to surgical blood loss or risk of bleeding: not applicable

## 2012-09-13 NOTE — Transfer of Care (Signed)
Immediate Anesthesia Transfer of Care Note  Patient: Mathew Adams  Procedure(s) Performed: Procedure(s) (LRB) with comments: CLOSED MANIPULATION KNEE (Right)  Patient Location: PACU  Anesthesia Type:General  Level of Consciousness: awake, alert  and oriented  Airway & Oxygen Therapy: Patient Spontanous Breathing and Patient connected to face mask oxygen  Post-op Assessment: Report given to PACU RN and Post -op Vital signs reviewed and stable  Post vital signs: Reviewed and stable  Complications: No apparent anesthesia complications

## 2012-09-13 NOTE — Anesthesia Postprocedure Evaluation (Signed)
Anesthesia Post Note  Patient: Mathew Adams  Procedure(s) Performed: Procedure(s) (LRB): CLOSED MANIPULATION KNEE (Right)  Anesthesia type: General  Patient location: PACU  Post pain: Pain level controlled  Post assessment: Post-op Vital signs reviewed  Last Vitals: BP 131/67  Pulse 54  Temp 36.4 C  Resp 13  SpO2 96%  Post vital signs: Reviewed  Level of consciousness: sedated  Complications: No apparent anesthesia complications

## 2012-09-13 NOTE — Addendum Note (Signed)
Addendum  created 09/13/12 1531 by Florene Route, CRNA   Modules edited:Charges VN

## 2012-09-13 NOTE — Anesthesia Procedure Notes (Signed)
Anesthesia Regional Block:  Femoral nerve block  Pre-Anesthetic Checklist: ,, timeout performed, Correct Patient, Correct Site, Correct Laterality, Correct Procedure, Correct Position, site marked, Risks and benefits discussed,  Surgical consent,  Pre-op evaluation,  At surgeon's request and post-op pain management  Laterality: Right  Prep: Dura Prep       Needles:  Injection technique: Single-shot  Needle Type: Stimulator Needle - 80          Additional Needles:  Procedures: ultrasound guided (picture in chart) and nerve stimulator Femoral nerve block  Nerve Stimulator or Paresthesia:  Response: Motor response, 0.5 mA,   Additional Responses:   Narrative:  Start time: 09/13/2012 10:50 AM End time: 09/13/2012 10:56 AM Injection made incrementally with aspirations every 5 mL.  Performed by: Personally  Anesthesiologist: Lucille Passy MD

## 2012-09-14 ENCOUNTER — Encounter (HOSPITAL_COMMUNITY): Payer: Self-pay | Admitting: Orthopedic Surgery

## 2012-09-14 NOTE — Op Note (Signed)
[name], [name]               ACCOUNT NO.:  [id number]  MEDICAL RECORD NO.:  [id number]  LOCATION:  [facility]                         FACILITY:  [facility]  PHYSICIAN:  Madlyn Frankel. Charlann Boxer, M.D.  DATE OF BIRTH:  08-Jul-1951  DATE OF PROCEDURE:  09/13/2012 DATE OF DISCHARGE:  09/13/2012                              OPERATIVE REPORT   PREOPERATIVE DIAGNOSIS:  Recurrent painful stiff right total knee following right total knee replacement in May 2012 and repeat index manipulation done approximately 2 months ago.  POSTOPERATIVE DIAGNOSIS:  Recurrent painful stiff right total knee following right total knee replacement in May 2012 and repeat index manipulation done approximately 2 months ago.  PROCEDURE:  Repeat manipulation under anesthesia, right knee.  ANESTHESIA:  Preoperative regional femoral nerve block.  SURGEON:  Madlyn Frankel. Charlann Boxer, M.D.  ASSISTANT:  Surgical team.  COMPLICATIONS:  None.  FINDINGS:  The patient's preoperative motion under anesthesia was about 70-80 degrees of passive flexion with the hip flexed.  With manipulation, I was able to audibly hear some lysis of adhesions but found that he was still very tight.  Passively, he has only flexed to about 100 degrees with some pressure to about 110.  INDICATIONS FOR PROCEDURE:  [name] is a 62 year old male with a history of right total knee replacement in May 2012.  He is seen and evaluated as second opinion for painful stiff knee.  I recommended he consider having the manipulation to try to improve his overall motion and outcome of the pain.  This initial index manipulation was performed about 2 months ago.  He presented in followup in an 8-week visit and was noted to have recurrent loss of motion.  In an effort to try to maximize his overall chances of recovery, I suggested repeat manipulation definitely over any options of an open procedure at this point.  I also did not feel that he was going to be successful with  continued nonoperative management with therapy, risks of recurrence of his stiffness and pain, the fact that this may be the last manipulation that he has and that the procedure may require open repair but would not be done for at least 2 years after this.  Standard risks of infection, DVTs were minimized, this is a closed procedure.  PROCEDURE IN DETAIL:  The patient was brought to the operative theater. Once adequate anesthesia was established, preoperative antibiotics were not administered again as it was a closed procedure.  We did a time out, identifying the patient, planned procedure, and extremity.  At this point, I examined the knee and confirmed range of motion and then began to work passively on extension with his leg placed on my shoulder downward.  Pressure was applied to the knee.  There was no significant lysis of adhesions.  Only was able to get him stretched to about 5 degrees.  It was unable to get him fully straight.  At this point with the hip flexed and then the knee flexed, I applied pressure across the proximal tibia and distal femur.  I was able to lyse adhesions and break through some scar tissue initially; however, I only was able to do this to about 100  degrees.  With further pressure and stretch, I was able to get him to about 110 degrees, maybe to 120 with a lot of pressure but did not want to go further.  This signified significant both intra and extra-articular scar under the capsular tissues as well as surrounding musculature.  Following this and reviewing the motion and further stretching to make certain I was not going to get anymore motion, the procedure concluded.  He was brought to the recovery room with the knee immobilizer.  He will be discharged home.  He has a home CPM machine to utilize as well as outpatient physical therapy schedule for tomorrow.  Pain medication will be prescribed at the time of discharge.  He will not require  DVT prophylaxis.     Madlyn Frankel Charlann Boxer, M.D.     MDO/MEDQ  D:  09/13/2012  T:  09/14/2012  Job:  [id number]

## 2013-05-19 ENCOUNTER — Emergency Department: Payer: Self-pay | Admitting: Emergency Medicine

## 2013-05-19 LAB — CBC
HCT: 43.2 % (ref 40.0–52.0)
MCH: 31.5 pg (ref 26.0–34.0)
MCHC: 35.6 g/dL (ref 32.0–36.0)
MCV: 89 fL (ref 80–100)
RDW: 13.2 % (ref 11.5–14.5)

## 2013-05-19 LAB — COMPREHENSIVE METABOLIC PANEL
Albumin: 3.7 g/dL (ref 3.4–5.0)
Anion Gap: 6 — ABNORMAL LOW (ref 7–16)
BUN: 10 mg/dL (ref 7–18)
Calcium, Total: 9.1 mg/dL (ref 8.5–10.1)
Chloride: 107 mmol/L (ref 98–107)
EGFR (Non-African Amer.): >60
Glucose: 102 mg/dL — ABNORMAL HIGH (ref 65–99)
Potassium: 3.7 mmol/L (ref 3.5–5.1)
SGPT (ALT): 25 U/L (ref 12–78)
Total Protein: 7.1 g/dL (ref 6.4–8.2)

## 2013-05-19 LAB — URINALYSIS, COMPLETE
Bacteria: NONE SEEN
Ketone: NEGATIVE
Leukocyte Esterase: NEGATIVE
Nitrite: NEGATIVE
Ph: 5 (ref 4.5–8.0)
Protein: NEGATIVE
Specific Gravity: 1.014 (ref 1.003–1.030)
WBC UR: 2 /HPF (ref 0–5)

## 2013-09-30 ENCOUNTER — Emergency Department: Payer: Self-pay | Admitting: Emergency Medicine

## 2013-09-30 LAB — CBC
HCT: 44 % (ref 40.0–52.0)
HGB: 15.3 g/dL (ref 13.0–18.0)
MCH: 30.9 pg (ref 26.0–34.0)
MCHC: 34.7 g/dL (ref 32.0–36.0)
MCV: 89 fL (ref 80–100)
PLATELETS: 160 10*3/uL (ref 150–440)
RBC: 4.94 10*6/uL (ref 4.40–5.90)
RDW: 13.5 % (ref 11.5–14.5)
WBC: 5.3 10*3/uL (ref 3.8–10.6)

## 2013-09-30 LAB — COMPREHENSIVE METABOLIC PANEL
ALBUMIN: 3.5 g/dL (ref 3.4–5.0)
Alkaline Phosphatase: 89 U/L
Anion Gap: 7 (ref 7–16)
BUN: 13 mg/dL (ref 7–18)
Bilirubin,Total: 0.6 mg/dL (ref 0.2–1.0)
CALCIUM: 8.5 mg/dL (ref 8.5–10.1)
CHLORIDE: 107 mmol/L (ref 98–107)
Co2: 23 mmol/L (ref 21–32)
Creatinine: 1.04 mg/dL (ref 0.60–1.30)
EGFR (African American): >60
EGFR (Non-African Amer.): >60
GLUCOSE: 95 mg/dL (ref 65–99)
Osmolality: 274 (ref 275–301)
POTASSIUM: 3.4 mmol/L — AB (ref 3.5–5.1)
SGOT(AST): 33 U/L (ref 15–37)
SGPT (ALT): 41 U/L (ref 12–78)
Sodium: 137 mmol/L (ref 136–145)
Total Protein: 7.2 g/dL (ref 6.4–8.2)

## 2013-09-30 LAB — LIPASE, BLOOD: Lipase: 90 U/L (ref 73–393)

## 2014-02-03 IMAGING — CT CT ABD-PELV W/O CM
1 of 2 series · 15 of 32 positions shown, 19 images · non-contrast
Comparison: none

REASON FOR EXAM: (1) right flank pain; (2) right flank pain
COMMENTS:   May transport without cardiac monitor

PROCEDURE:     CT  - CT ABDOMEN AND PELVIS W[DATE]  [DATE]
RESULT:     CT abdomen and pelvis dated 05/19/2013 comparison to prior dated
09/04/2009.
TECHNIQUE: Helical noncontrasted 3 mm sections were obtained from the lung
bases the pubic symphysis.

[Series 2: 3mm soft tissue · axial · 0.79mm/px · z∈[-1036,-568]mm · 15 of 172 slices shown, 19 images]
[im 8/172  soft-tissue]
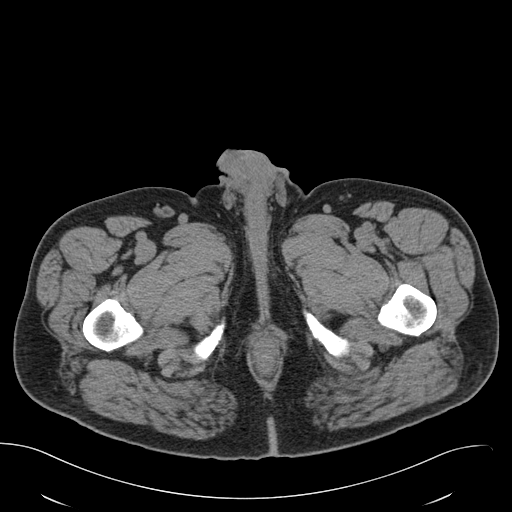
[im 8/172  bone]
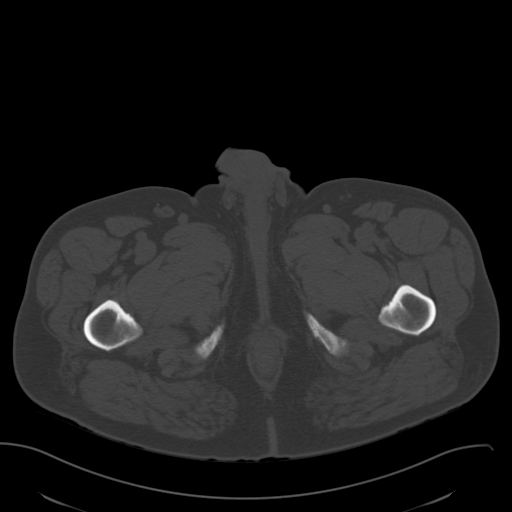
[im 23/172  soft-tissue]
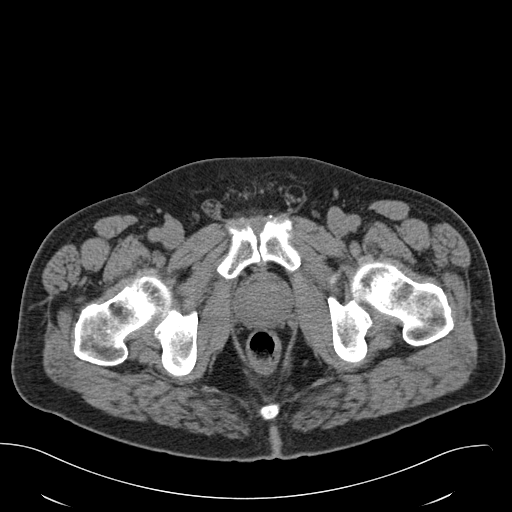
[im 38/172  soft-tissue]
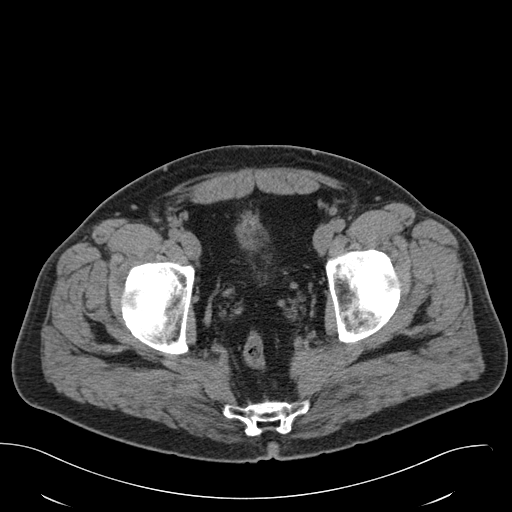
[im 45/172  soft-tissue]
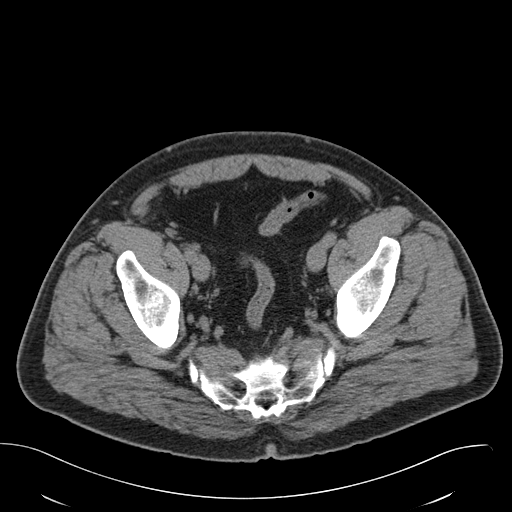
[im 60/172  soft-tissue]
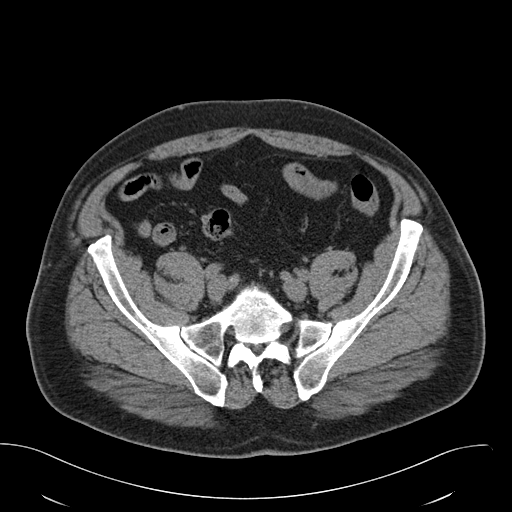
[im 75/172  soft-tissue]
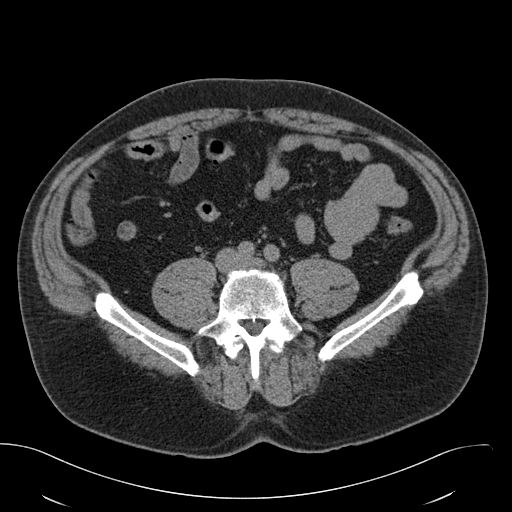
[im 90/172  soft-tissue]
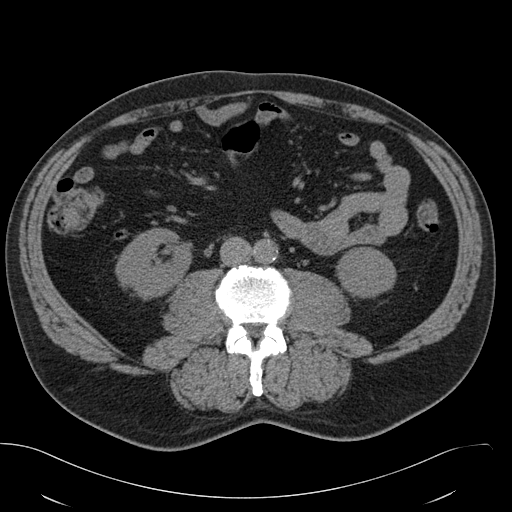
[im 97/172  soft-tissue]
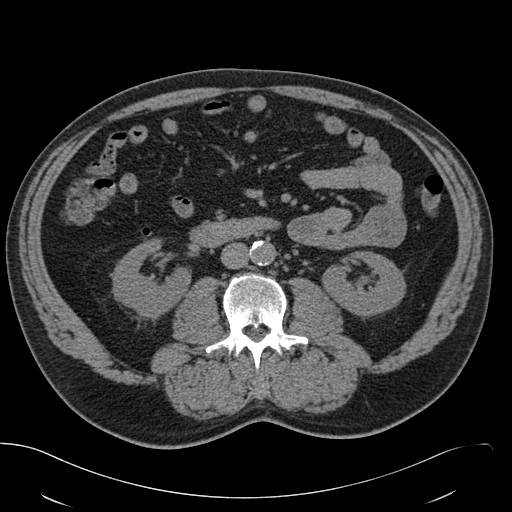
[im 112/172  soft-tissue]
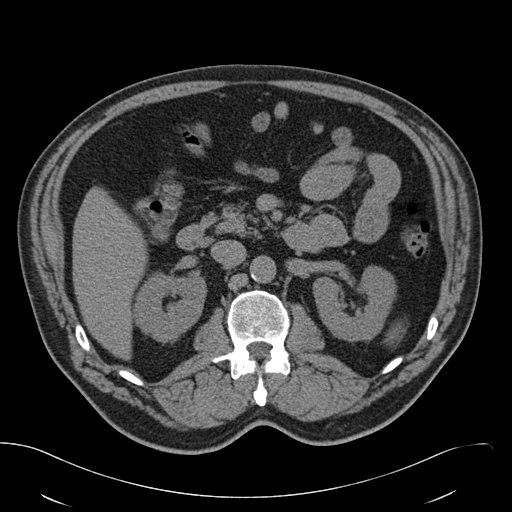
[im 112/172  bone]
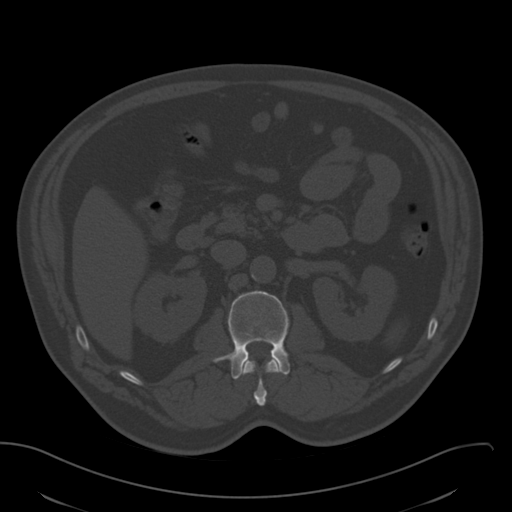
[im 127/172  soft-tissue]
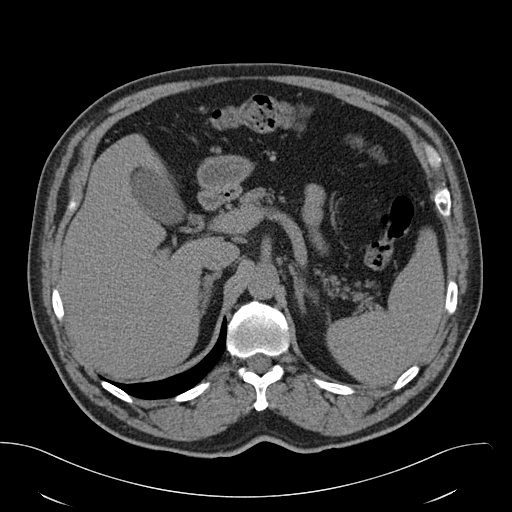
[im 134/172  soft-tissue]
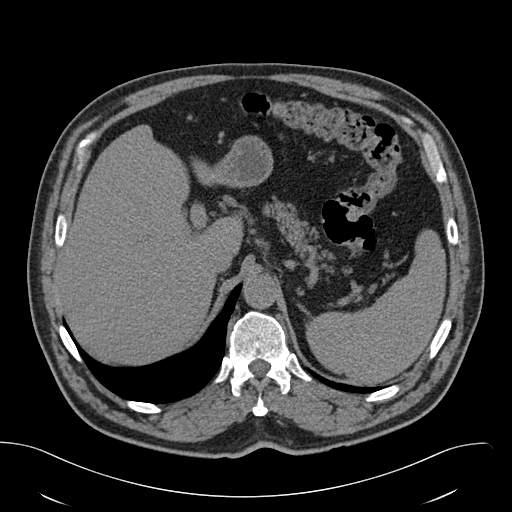
[im 142/172  lung]
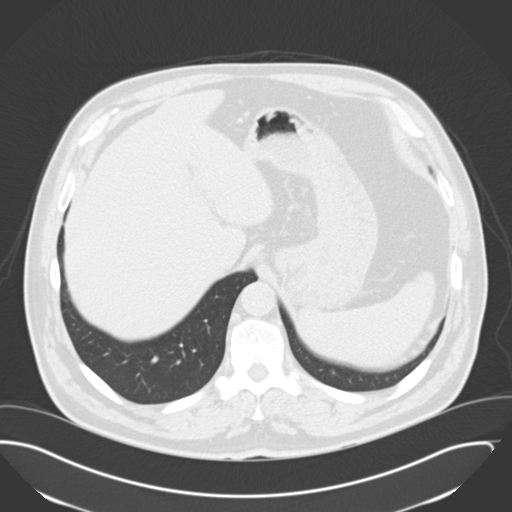
[im 149/172  soft-tissue]
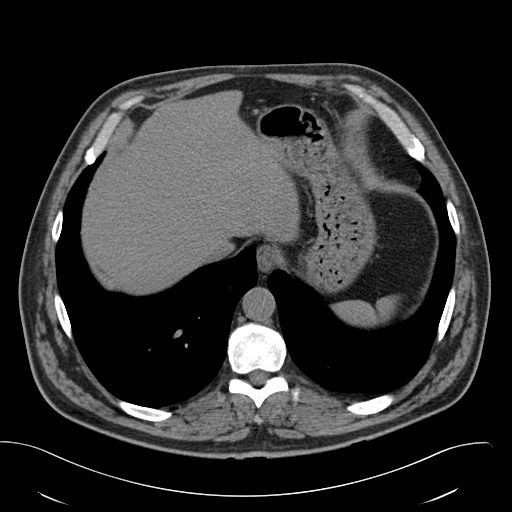
[im 149/172  lung]
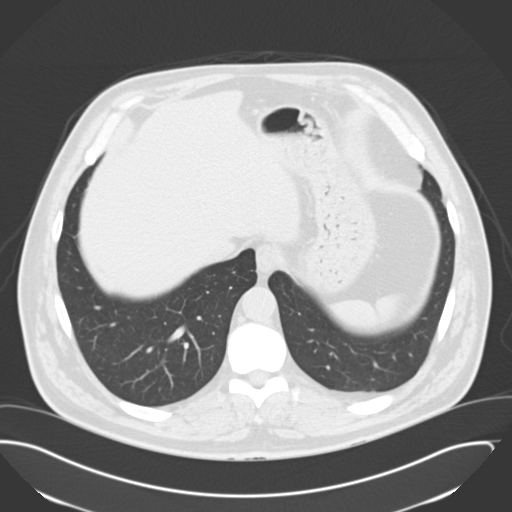
[im 157/172  lung]
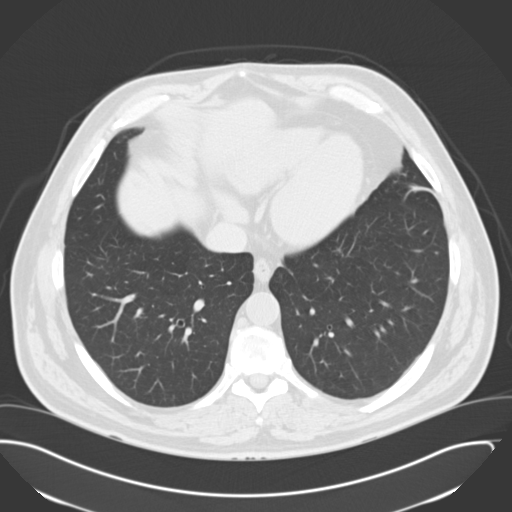
[im 164/172  soft-tissue]
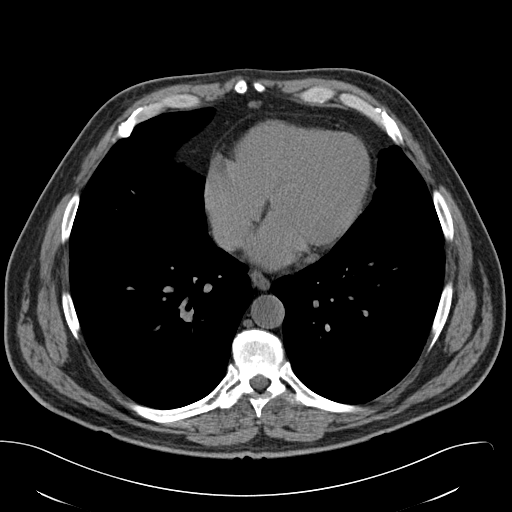
[im 164/172  lung]
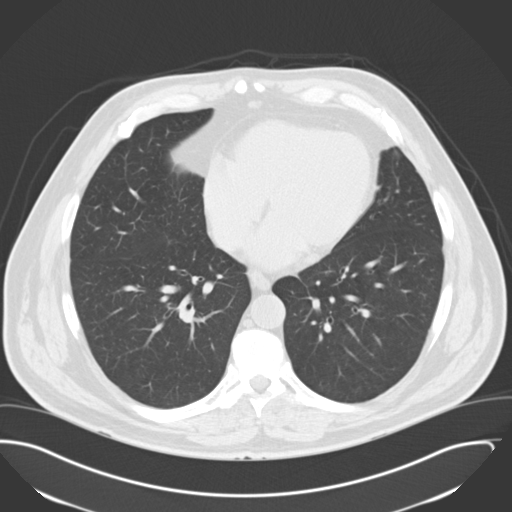

[15 of 32 positions shown; findings below may reference images not displayed]

FINDINGS: The lung bases are unremarkable.

The liver, spleen, adrenals, pancreas, left kidney are unremarkable.

Elation of the right kidney demonstrates minimal stranding in the
perinephric fat. There is no evidence of hydronephrosis, hydroureter, nor
nephro or ureteral lithiasis. There is evidence of mild pelviectasis.

There is no CT evidence of bowel obstruction, enteritis, colitis common nor
diverticulitis within the limitations of a noncontrasted CT. The appendix is
identified and is unremarkable. There is no evidence of abdominal aortic
aneurysm.
IMPRESSION: Minimal findings which may represent sequela of a previous
calculus in the right urinary collecting system.
2. Otherwise no evidence of obstructive or inflammatory abnormalities.

## 2014-09-02 ENCOUNTER — Emergency Department: Payer: Self-pay | Admitting: Emergency Medicine

## 2014-09-02 LAB — CBC
HCT: 51.4 % (ref 40.0–52.0)
HGB: 16.9 g/dL (ref 13.0–18.0)
MCH: 30.4 pg (ref 26.0–34.0)
MCHC: 32.9 g/dL (ref 32.0–36.0)
MCV: 92 fL (ref 80–100)
PLATELETS: 219 10*3/uL (ref 150–440)
RBC: 5.57 10*6/uL (ref 4.40–5.90)
RDW: 13.5 % (ref 11.5–14.5)
WBC: 11.3 10*3/uL — AB (ref 3.8–10.6)

## 2014-09-02 LAB — BASIC METABOLIC PANEL
Anion Gap: 9 (ref 7–16)
BUN: 17 mg/dL (ref 7–18)
CALCIUM: 9.1 mg/dL (ref 8.5–10.1)
CO2: 24 mmol/L (ref 21–32)
Chloride: 108 mmol/L — ABNORMAL HIGH (ref 98–107)
Creatinine: 1.04 mg/dL (ref 0.60–1.30)
GLUCOSE: 105 mg/dL — AB (ref 65–99)
OSMOLALITY: 283 (ref 275–301)
Potassium: 3.6 mmol/L (ref 3.5–5.1)
SODIUM: 141 mmol/L (ref 136–145)

## 2014-09-02 LAB — TROPONIN I

## 2014-12-03 NOTE — Discharge Summary (Signed)
PATIENT NAME:  Mathew Adams, Mathew Adams MR#:  [id number] DATE OF BIRTH:  1950-08-29  DATE OF ADMISSION:  12/31/2011 DATE OF DISCHARGE:  01/03/2012  DISCHARGE DIAGNOSES:  1. Severe degenerative arthritis, right knee.  2. Hiatal hernia.  3. Chronic obstructive pulmonary disease.  4. Diverticulitis.   OPERATIONS/PROCEDURES PERFORMED: Right total knee arthroplasty on 12/31/2011.   PHYSICAL EXAMINATION: As written on admission.   LABORATORY DATA: As noted in the chart.   HOSPITAL COURSE: Preoperative medical clearance was obtained for total knee arthroplasty. Following appropriate informed consent, the patient was taken to the operating room on 12/31/2011 where total knee arthroplasty was performed without difficulty. Postoperatively he had a benign course with a moderate amount of pain. His Hemovac was removed on postoperative day number one and physical therapy was begun with the standard total knee protocol of range of motion and ambulation, weight-bearing as tolerated. He progressed such that on 01/03/2012 he was doing well and he was discharged home with home health physical therapy consultation. He is to wear his knee high TED stockings. In addition to his regular medications, he is to take aspirin, enteric-coated, one tablet per day as prophylaxis for venous thrombosis and pulmonary embolism, and he is given a  prescription for oxycodone to be taken every three hours as necessary for pain. He is to return to the office in 10 days for exam, x-rays, and probable staple removal. ____________________________ Lucas Mallow, MD ces:slb D: 01/16/2012 12:19:13 ET    T: 01/16/2012 12:42:31 ET        JOB#: [id number] Lucas Mallow MD ELECTRONICALLY SIGNED 01/16/2012 14:11

## 2014-12-03 NOTE — Op Note (Signed)
PATIENT NAME:  Mathew Adams, Mathew Adams MR#:  [id number] DATE OF BIRTH:  [birth year]  DATE OF PROCEDURE:  12/31/2011  PREOPERATIVE DIAGNOSIS: Degenerative arthritis, right knee.   POSTOPERATIVE DIAGNOSIS: Degenerative arthritis, right knee.   PROCEDURE: Right total knee arthroplasty.   SURGEON: Lucas Mallow, MD  ASSISTANT: Earnestine Leys, M.D.   ANESTHESIA: Spinal.   COMPLICATIONS: None.   TOURNIQUET TIME: 90 minutes.   DESCRIPTION OF PROCEDURE: 2 grams of Ancef was given intravenously prior to the procedure. Spinal anesthesia was induced. The right lower extremity is thoroughly prepped with alcohol and ChloraPrep and draped in standard sterile fashion. Extremity is wrapped out with the Esmarch bandage and pneumatic tourniquet elevated to 350 mmHg. Total tourniquet time is 90 minutes. Anterior standard longitudinal incision is made and the dissection carried down to the patella and the medial and lateral retinaculum is carefully dissected out. Medial parapatellar incision is made and the knee is flexed and the patella is reflected laterally. Standard synovial and osteophyte debridement is performed. Retractors are placed and the proximal tibial cutting guide is put into appropriate position and pinned. Proximal tibia is then cut at the appropriate length and angle. The distal femur is sized as a large. The femoral cutting block is put into place and the central drill hole made. The 10 mm spacer block is put into position and is seen to show excellent alignment. The cutting block is pinned and then the anterior and posterior femoral cuts are made. The 5 degrees valgus distal femoral cutting block is impacted into place and pinned and the distal femoral cut is made. 10 mm spacer block is put into position in both extension and flexion and is seen to be satisfactory with excellent alignment. Femoral shaping guide is impacted into place and the appropriate cuts made and the two drill holes also were  made. Proximal tibia is then revealed with the retractors and the +5 tibial size is chosen. The cutting block is pinned into place and the central drill hole made. All trial components are then inserted and there is seen to be excellent alignment and fixation. The patella is performed in standard fashion. All trial components are then removed. The joint is thoroughly irrigated multiple times. The #5 tibial tray is then cemented into place. The LCS rotating platform 10 mm polyethylene insert is put into position and then the porous-coated large femoral component is impacted into place. All cement is removed and the knee is extended. The porous-coated patella is impacted into place and this is a large. Wound is thoroughly irrigated multiple times throughout the case with pulsatile lavage. Two medium Autovac drains are brought out through separate stab wound incisions. Retinaculum is repaired with multiple 0 Ethibond sutures. Subcutaneous tissue is closed with 2-0 Vicryl and the skin is closed with the skin stapler. Soft bulky dressing with a Polar Care and a knee immobilizer is applied and the tourniquet is released and the patient is returned to recovery room in satisfactory condition having tolerated the procedure quite well.    ____________________________ Lucas Mallow, MD ces:cms D: 12/31/2011 10:11:08 ET T: 12/31/2011 11:13:14 ET JOB#: [id number]  cc: Lucas Mallow, MD, <Dictator> Lucas Mallow MD ELECTRONICALLY SIGNED 01/02/2012 7:26

## 2015-01-10 DIAGNOSIS — Z1211 Encounter for screening for malignant neoplasm of colon: Secondary | ICD-10-CM | POA: Insufficient documentation

## 2015-01-10 DIAGNOSIS — Z860100 Personal history of colon polyps, unspecified: Secondary | ICD-10-CM | POA: Insufficient documentation

## 2015-06-28 ENCOUNTER — Ambulatory Visit: Payer: BC Managed Care – PPO | Admitting: Anesthesiology

## 2015-06-28 ENCOUNTER — Ambulatory Visit
Admission: RE | Admit: 2015-06-28 | Discharge: 2015-06-28 | Disposition: A | Discharge status: Home / Self Care | Payer: BC Managed Care – PPO | Source: Ambulatory Visit | Attending: Unknown Physician Specialty | Admitting: Unknown Physician Specialty

## 2015-06-28 ENCOUNTER — Encounter
Admission: RE | Disposition: A | Discharge status: Home / Self Care | Payer: Self-pay | Source: Ambulatory Visit | Attending: Unknown Physician Specialty

## 2015-06-28 DIAGNOSIS — M1990 Unspecified osteoarthritis, unspecified site: Secondary | ICD-10-CM | POA: Insufficient documentation

## 2015-06-28 DIAGNOSIS — Z9889 Other specified postprocedural states: Secondary | ICD-10-CM | POA: Insufficient documentation

## 2015-06-28 DIAGNOSIS — Z7982 Long term (current) use of aspirin: Secondary | ICD-10-CM | POA: Insufficient documentation

## 2015-06-28 DIAGNOSIS — Z96651 Presence of right artificial knee joint: Secondary | ICD-10-CM | POA: Diagnosis not present

## 2015-06-28 DIAGNOSIS — I10 Essential (primary) hypertension: Secondary | ICD-10-CM | POA: Insufficient documentation

## 2015-06-28 DIAGNOSIS — K219 Gastro-esophageal reflux disease without esophagitis: Secondary | ICD-10-CM | POA: Insufficient documentation

## 2015-06-28 DIAGNOSIS — Z79899 Other long term (current) drug therapy: Secondary | ICD-10-CM | POA: Insufficient documentation

## 2015-06-28 DIAGNOSIS — D123 Benign neoplasm of transverse colon: Secondary | ICD-10-CM | POA: Diagnosis not present

## 2015-06-28 DIAGNOSIS — Z86010 Personal history of colonic polyps: Secondary | ICD-10-CM | POA: Insufficient documentation

## 2015-06-28 DIAGNOSIS — D122 Benign neoplasm of ascending colon: Secondary | ICD-10-CM | POA: Diagnosis not present

## 2015-06-28 DIAGNOSIS — F17210 Nicotine dependence, cigarettes, uncomplicated: Secondary | ICD-10-CM | POA: Insufficient documentation

## 2015-06-28 HISTORY — PX: COLONOSCOPY WITH PROPOFOL: SHX5780

## 2015-06-28 HISTORY — DX: Essential (primary) hypertension: I10

## 2015-06-28 SURGERY — COLONOSCOPY WITH PROPOFOL
Anesthesia: General

## 2015-06-28 MED ORDER — FENTANYL CITRATE (PF) 100 MCG/2ML IJ SOLN
INTRAMUSCULAR | Status: DC | PRN
  Administered 2015-06-28: 50 ug via INTRAVENOUS

## 2015-06-28 MED ORDER — PHENYLEPHRINE HCL 10 MG/ML IJ SOLN
INTRAMUSCULAR | Status: DC | PRN
  Administered 2015-06-28 (×5): 100 ug via INTRAVENOUS

## 2015-06-28 MED ORDER — VANCOMYCIN HCL IN DEXTROSE 1-5 GM/200ML-% IV SOLN
1000.0000 mg | Freq: Once | INTRAVENOUS | Status: AC
  Administered 2015-06-28: 1000 mg via INTRAVENOUS
  Filled 2015-06-28: qty 200

## 2015-06-28 MED ORDER — LIDOCAINE HCL (CARDIAC) 20 MG/ML IV SOLN
INTRAVENOUS | Status: DC | PRN
  Administered 2015-06-28: 60 mg via INTRAVENOUS

## 2015-06-28 MED ORDER — PROPOFOL 10 MG/ML IV BOLUS
INTRAVENOUS | Status: DC | PRN
  Administered 2015-06-28: 65 mg via INTRAVENOUS

## 2015-06-28 MED ORDER — GENTAMICIN IN SALINE 1-0.9 MG/ML-% IV SOLN: 100.0000 mg | Freq: Once | INTRAVENOUS | Status: DC

## 2015-06-28 MED ORDER — GENTAMICIN SULFATE 40 MG/ML IJ SOLN
100.0000 mg | Freq: Once | INTRAVENOUS | Status: DC
  Filled 2015-06-28: qty 2.5

## 2015-06-28 MED ORDER — MIDAZOLAM HCL 2 MG/2ML IJ SOLN
INTRAMUSCULAR | Status: DC | PRN
  Administered 2015-06-28: 1 mg via INTRAVENOUS

## 2015-06-28 MED ORDER — PROPOFOL 500 MG/50ML IV EMUL
INTRAVENOUS | Status: DC | PRN
  Administered 2015-06-28: 120 ug/kg/min via INTRAVENOUS

## 2015-06-28 MED ORDER — SODIUM CHLORIDE 0.9 % IV SOLN
INTRAVENOUS | Status: DC
  Administered 2015-06-28: 1000 mL via INTRAVENOUS

## 2015-06-28 MED ORDER — SODIUM CHLORIDE 0.9 % IV SOLN: INTRAVENOUS | Status: DC

## 2015-06-28 NOTE — Op Note (Signed)
[facility] Gastroenterology Patient Name: Mathew Adams Procedure Date: 06/28/2015 3:22 PM MRN: [id number] Account #: [id number] Date of Birth: 04-14-1951 Admit Type: Outpatient Age: 64 Room: [facility] ENDO ROOM 1 Gender: Male Note Status: Finalized Procedure:         Colonoscopy Indications:       High risk colon cancer surveillance: Personal history of                     colonic polyps Providers:         Manya Silvas, MD Referring MD:      Meindert A. Brunetta Genera, MD (Referring MD) Medicines:         Propofol per Anesthesia Complications:     No immediate complications. Procedure:         Pre-Anesthesia Assessment:                    - After reviewing the risks and benefits, the patient was                     deemed in satisfactory condition to undergo the procedure.                    After obtaining informed consent, the colonoscope was                     passed under direct vision. Throughout the procedure, the                     patient's blood pressure, pulse, and oxygen saturations                     were monitored continuously. The Colonoscope was                     introduced through the anus and advanced to the the cecum,                     identified by appendiceal orifice and ileocecal valve. The                     colonoscopy was somewhat difficult due to significant                     looping. Findings:      A 20 mm polyp was found in the proximal ascending colon. This was       remining from the previous colonoscopy The polyp was sessile. The polyp       was removed with a hot snare. Resection and retrieval were complete. The       base was treated with argon witth circle fire probe. To prevent bleeding       after the polypectomy, five hemostatic clips were successfully placed.      A small polyp was found at the hepatic flexure. The polyp was sessile.       The polyp was removed with a hot snare. Resection and retrieval were   complete. Other polyps were removedfroom ascending colon and hepatic       flexure aand recovered.They wer medium in size. A clip was removed       beeforre the 63mm polyp was removed. Impression:        - One 20 mm polyp in the proximal ascending colon.  Resected and retrieved. Clips were placed.                    - One small polyp at the hepatic flexure. Resected and                     retrieved. Recommendation:    - Await pathology results. Manya Silvas, MD 06/28/2015 4:58:37 PM This report has been signed electronically. Number of Addenda: 0 Note Initiated On: 06/28/2015 3:22 PM Scope Withdrawal Time: 0 hours 50 minutes 0 seconds  Total Procedure Duration: 1 hour 6 minutes 6 seconds       [facility]

## 2015-06-28 NOTE — Anesthesia Postprocedure Evaluation (Signed)
  Anesthesia Post-op Note  Patient: Mathew Adams  Procedure(s) Performed: Procedure(s): COLONOSCOPY WITH PROPOFOL (N/A)  Anesthesia type:General  Patient location: PACU  Post pain: Pain level controlled  Post assessment: Post-op Vital signs reviewed, Patient's Cardiovascular Status Stable, Respiratory Function Stable, Patent Airway and No signs of Nausea or vomiting  Post vital signs: Reviewed and stable  Last Vitals:  Filed Vitals:   06/28/15 1715  BP: 132/65  Pulse: 57  Temp:   Resp: 19    Level of consciousness: awake, alert  and patient cooperative  Complications: No apparent anesthesia complications

## 2015-06-28 NOTE — Anesthesia Preprocedure Evaluation (Signed)
Anesthesia Evaluation  Patient identified by MRN, date of birth, ID band Patient awake    Reviewed: Allergy & Precautions, H&P , NPO status , Patient's Chart, lab work & pertinent test results, reviewed documented beta blocker date and time   History of Anesthesia Complications (+) PONV and history of anesthetic complications  Airway Mallampati: IV  TM Distance: >3 FB Neck ROM: full    Dental no notable dental hx. (+) Edentulous Upper, Missing, Poor Dentition   Pulmonary neg shortness of breath, neg sleep apnea, neg COPD, Recent URI , Resolved, Current Smoker,    Pulmonary exam normal breath sounds clear to auscultation       Cardiovascular Exercise Tolerance: Good hypertension, On Medications (-) angina(-) CAD, (-) Past MI, (-) Cardiac Stents and (-) CABG Normal cardiovascular exam(-) dysrhythmias (-) Valvular Problems/Murmurs Rhythm:regular Rate:Normal     Neuro/Psych negative neurological ROS  negative psych ROS   GI/Hepatic Neg liver ROS, GERD  Medicated and Controlled,  Endo/Other  negative endocrine ROS  Renal/GU negative Renal ROS  negative genitourinary   Musculoskeletal   Abdominal   Peds  Hematology negative hematology ROS (+)   Anesthesia Other Findings Past Medical History:   GERD (gastroesophageal reflux disease)                       Arthritis                                                    PONV (postoperative nausea and vomiting)                     Hypertension                                                 Reproductive/Obstetrics negative OB ROS                             Anesthesia Physical Anesthesia Plan  ASA: III  Anesthesia Plan: General   Post-op Pain Management:    Induction:   Airway Management Planned:   Additional Equipment:   Intra-op Plan:   Post-operative Plan:   Informed Consent: I have reviewed the patients History and Physical, chart,  labs and discussed the procedure including the risks, benefits and alternatives for the proposed anesthesia with the patient or authorized representative who has indicated his/her understanding and acceptance.   Dental Advisory Given  Plan Discussed with: Anesthesiologist, CRNA and Surgeon  Anesthesia Plan Comments:         Anesthesia Quick Evaluation

## 2015-06-28 NOTE — OR Nursing (Signed)
Polyp at ascending colon was clipped;  clip removed with bx forcep. Polyp snared and removed colonoscopy continues Area in asc. argoned and clipped. x5 .

## 2015-06-28 NOTE — Transfer of Care (Signed)
Immediate Anesthesia Transfer of Care Note  Patient: [name]  Procedure(s) Performed: Procedure(s): COLONOSCOPY WITH PROPOFOL (N/A)  Patient Location: PACU  Anesthesia Type:General  Level of Consciousness: awake and patient cooperative  Airway & Oxygen Therapy: Patient Spontanous Breathing and Patient connected to nasal cannula oxygen  Post-op Assessment: Report given to RN and Post -op Vital signs reviewed and stable  Post vital signs: Reviewed and stable  Last Vitals:  Filed Vitals:   06/28/15 1647  BP: 130/68  Pulse: 74  Temp: 36.1 C  Resp: 12    Complications: No apparent anesthesia complications

## 2015-06-28 NOTE — H&P (Signed)
Primary Care Physician:  Lorelee Market, MD Primary Gastroenterologist:  Dr. Vira Agar  Pre-Procedure History & Physical: HPI:  Mathew Adams is a 64 y.o. male is here for an colonoscopy.   Past Medical History  Diagnosis Date  . GERD (gastroesophageal reflux disease)   . Arthritis   . PONV (postoperative nausea and vomiting)   . Hypertension     Past Surgical History  Procedure Laterality Date  . Left rotator cuff surgery     . Left knee arthroscopy     . Knee closed reduction  06/24/2012    Procedure: CLOSED MANIPULATION KNEE;  Surgeon: Mauri Pole, MD;  Location: [facility] ORS;  Service: Orthopedics;  Laterality: Right;  . Joint replacement      right knee replacement  . Knee closed reduction  09/13/2012    Procedure: CLOSED MANIPULATION KNEE;  Surgeon: Mauri Pole, MD;  Location: [facility] ORS;  Service: Orthopedics;  Laterality: Right;    Prior to Admission medications   Medication Sig Start Date End Date Taking? Authorizing Provider  esomeprazole (NEXIUM) 40 MG capsule Take 40 mg by mouth daily at 12 noon.   Yes Historical Provider, MD  lansoprazole (PREVACID) 30 MG capsule Take 30 mg by mouth every morning.    Yes Historical Provider, MD  lisinopril (PRINIVIL,ZESTRIL) 10 MG tablet Take 10 mg by mouth daily.   Yes Historical Provider, MD  aspirin EC 325 MG EC tablet Take 1 tablet (325 mg total) by mouth daily. 06/25/12   Danae Orleans, PA-C  celecoxib (CELEBREX) 200 MG capsule Take 200 mg by mouth daily.    Historical Provider, MD  docusate sodium (COLACE) 100 MG capsule Take 100 mg by mouth daily.    Historical Provider, MD  HYDROcodone-acetaminophen (NORCO) 7.5-325 MG per tablet Take 1-2 tablets by mouth every 4 (four) hours as needed for pain. 09/13/12   Danae Orleans, PA-C    Allergies as of 05/24/2015  . (No Known Allergies)    History reviewed. No pertinent family history.  Social History   Social History  . Marital Status: Single    Spouse Name: N/A  .  Number of Children: N/A  . Years of Education: N/A   Occupational History  . Not on file.   Social History Main Topics  . Smoking status: Current Every Day Smoker -- 0.50 packs/day for 45 years    Types: Cigarettes  . Smokeless tobacco: Never Used  . Alcohol Use: No  . Drug Use: No  . Sexual Activity: Not on file   Other Topics Concern  . Not on file   Social History Narrative    Review of Systems: See HPI, otherwise negative ROS  Physical Exam: BP 143/61 mmHg  Pulse 72  Temp(Src) 98.4 F (36.9 C) (Tympanic)  Resp 20  Ht 6\' 1"  (1.854 m)  Wt 104.327 kg (230 lb)  BMI 30.35 kg/m2  SpO2 99% General:   Alert,  pleasant and cooperative in NAD Head:  Normocephalic and atraumatic. Neck:  Supple; no masses or thyromegaly. Lungs:  Clear throughout to auscultation.    Heart:  Regular rate and rhythm. Abdomen:  Soft, nontender and nondistended. Normal bowel sounds, without guarding, and without rebound.   Neurologic:  Alert and  oriented x4;  grossly normal neurologically.  Impression/Plan: Mathew Adams is here for an colonoscopy to be performed for [facility] colon polyps  Risks, benefits, limitations, and alternatives regarding  colonoscopy have been reviewed with the patient.  Questions have been answered.  All parties agreeable.   Gaylyn Cheers, MD  06/28/2015, 3:23 PM

## 2015-06-29 ENCOUNTER — Encounter: Payer: Self-pay | Admitting: Unknown Physician Specialty

## 2015-07-02 LAB — SURGICAL PATHOLOGY

## 2016-05-23 ENCOUNTER — Encounter: Payer: Self-pay | Admitting: Emergency Medicine

## 2016-05-23 ENCOUNTER — Emergency Department
Admission: EM | Admit: 2016-05-23 | Discharge: 2016-05-23 | Disposition: A | Discharge status: Home / Self Care | Payer: BC Managed Care – PPO | Attending: Emergency Medicine | Admitting: Emergency Medicine

## 2016-05-23 ENCOUNTER — Emergency Department: Payer: BC Managed Care – PPO

## 2016-05-23 DIAGNOSIS — Z79899 Other long term (current) drug therapy: Secondary | ICD-10-CM | POA: Insufficient documentation

## 2016-05-23 DIAGNOSIS — I10 Essential (primary) hypertension: Secondary | ICD-10-CM | POA: Diagnosis not present

## 2016-05-23 DIAGNOSIS — J9801 Acute bronchospasm: Secondary | ICD-10-CM | POA: Insufficient documentation

## 2016-05-23 DIAGNOSIS — J069 Acute upper respiratory infection, unspecified: Secondary | ICD-10-CM | POA: Insufficient documentation

## 2016-05-23 DIAGNOSIS — R0602 Shortness of breath: Secondary | ICD-10-CM | POA: Diagnosis not present

## 2016-05-23 DIAGNOSIS — Z7982 Long term (current) use of aspirin: Secondary | ICD-10-CM | POA: Diagnosis not present

## 2016-05-23 DIAGNOSIS — F17210 Nicotine dependence, cigarettes, uncomplicated: Secondary | ICD-10-CM | POA: Diagnosis not present

## 2016-05-23 DIAGNOSIS — R05 Cough: Secondary | ICD-10-CM | POA: Diagnosis present

## 2016-05-23 LAB — COMPREHENSIVE METABOLIC PANEL
ALBUMIN: 3.7 g/dL (ref 3.5–5.0)
ALT: 21 U/L (ref 17–63)
AST: 25 U/L (ref 15–41)
Alkaline Phosphatase: 77 U/L (ref 38–126)
Anion gap: 8 (ref 5–15)
BUN: 12 mg/dL (ref 6–20)
CHLORIDE: 106 mmol/L (ref 101–111)
CO2: 25 mmol/L (ref 22–32)
CREATININE: 0.88 mg/dL (ref 0.61–1.24)
Calcium: 8.9 mg/dL (ref 8.9–10.3)
GFR calc Af Amer: >60 mL/min (ref >60)
GFR calc non Af Amer: >60 mL/min (ref >60)
GLUCOSE: 100 mg/dL — AB (ref 65–99)
Potassium: 3.5 mmol/L (ref 3.5–5.1)
SODIUM: 139 mmol/L (ref 135–145)
Total Bilirubin: 0.8 mg/dL (ref 0.3–1.2)
Total Protein: 6.7 g/dL (ref 6.5–8.1)

## 2016-05-23 LAB — INFLUENZA PANEL BY PCR (TYPE A & B)
H1N1FLUPCR: NOT DETECTED
INFLAPCR: NEGATIVE
Influenza B By PCR: NEGATIVE

## 2016-05-23 LAB — CBC WITH DIFFERENTIAL/PLATELET
BASOS ABS: 0.1 10*3/uL (ref 0–0.1)
Basophils Relative: 1 %
EOS ABS: 0.3 10*3/uL (ref 0–0.7)
EOS PCT: 4 %
HCT: 42.6 % (ref 40.0–52.0)
HEMOGLOBIN: 15 g/dL (ref 13.0–18.0)
LYMPHS PCT: 20 %
Lymphs Abs: 1.3 10*3/uL (ref 1.0–3.6)
MCH: 31.2 pg (ref 26.0–34.0)
MCHC: 35.2 g/dL (ref 32.0–36.0)
MCV: 88.9 fL (ref 80.0–100.0)
Monocytes Absolute: 0.5 10*3/uL (ref 0.2–1.0)
Monocytes Relative: 8 %
NEUTROS PCT: 67 %
Neutro Abs: 4.5 10*3/uL (ref 1.4–6.5)
PLATELETS: 169 10*3/uL (ref 150–440)
RBC: 4.79 MIL/uL (ref 4.40–5.90)
RDW: 13.5 % (ref 11.5–14.5)
WBC: 6.7 10*3/uL (ref 3.8–10.6)

## 2016-05-23 LAB — TROPONIN I: Troponin I: <0.03 ng/mL (ref <0.03)

## 2016-05-23 LAB — BRAIN NATRIURETIC PEPTIDE: B NATRIURETIC PEPTIDE 5: 36 pg/mL (ref 0.0–100.0)

## 2016-05-23 MED ORDER — METHYLPREDNISOLONE SODIUM SUCC 125 MG IJ SOLR
INTRAMUSCULAR | Status: AC
  Administered 2016-05-23: 125 mg via INTRAVENOUS
  Filled 2016-05-23: qty 2

## 2016-05-23 MED ORDER — IPRATROPIUM-ALBUTEROL 0.5-2.5 (3) MG/3ML IN SOLN
3.0000 mL | Freq: Once | RESPIRATORY_TRACT | Status: AC
  Administered 2016-05-23: 3 mL via RESPIRATORY_TRACT

## 2016-05-23 MED ORDER — METHYLPREDNISOLONE SODIUM SUCC 125 MG IJ SOLR
125.0000 mg | Freq: Once | INTRAMUSCULAR | Status: AC
  Administered 2016-05-23: 125 mg via INTRAVENOUS

## 2016-05-23 MED ORDER — IPRATROPIUM-ALBUTEROL 0.5-2.5 (3) MG/3ML IN SOLN
RESPIRATORY_TRACT | Status: AC
  Administered 2016-05-23: 3 mL via RESPIRATORY_TRACT
  Filled 2016-05-23: qty 3

## 2016-05-23 MED ORDER — PREDNISONE 50 MG PO TABS: 50.0000 mg | ORAL_TABLET | Freq: Every day | ORAL | 0 refills | Status: DC

## 2016-05-23 MED ORDER — IPRATROPIUM-ALBUTEROL 0.5-2.5 (3) MG/3ML IN SOLN
RESPIRATORY_TRACT | Status: AC
  Administered 2016-05-23: 3 mL via RESPIRATORY_TRACT
  Filled 2016-05-23: qty 6

## 2016-05-23 MED ORDER — ALBUTEROL SULFATE HFA 108 (90 BASE) MCG/ACT IN AERS: 2.0000 | INHALATION_SPRAY | Freq: Four times a day (QID) | RESPIRATORY_TRACT | 2 refills | Status: DC | PRN

## 2016-05-23 NOTE — ED Triage Notes (Signed)
Pt presents with cough, generalized body aches, fever, chills. Recently seen by PCP and put zpack, pt finished all and not any better.

## 2016-05-23 NOTE — ED Provider Notes (Signed)
[facility] Emergency Department Provider Note   ____________________________________________    I have reviewed the triage vital signs and the nursing notes.   HISTORY  Chief Complaint Cough; Generalized Body Aches; and Chills    HPI Mathew Adams is a 65 y.o. male who presents with cough, generalized malaise for approximately 2 weeks. Patient reports he was put on a Z-Pak by his PCP a week ago but it did not seem to help. He reports mild shortness of breath, persistent nonproductive cough. He does admit to smoking for the last 30 years. Denies fevers or chills. No recent travel. No calf pain or swelling. No chest pain.   Past Medical History:  Diagnosis Date  . Arthritis   . GERD (gastroesophageal reflux disease)   . Hypertension   . PONV (postoperative nausea and vomiting)     Patient Active Problem List   Diagnosis Date Noted  . S/P right knee manipulation 09/13/2012  . Overweight (BMI 25.0-29.9) 06/25/2012    Past Surgical History:  Procedure Laterality Date  . COLONOSCOPY WITH PROPOFOL N/A 06/28/2015   Procedure: COLONOSCOPY WITH PROPOFOL;  Surgeon: Manya Silvas, MD;  Location: [facility] ENDOSCOPY;  Service: Endoscopy;  Laterality: N/A;  . JOINT REPLACEMENT     right knee replacement  . KNEE CLOSED REDUCTION  06/24/2012   Procedure: CLOSED MANIPULATION KNEE;  Surgeon: Mauri Pole, MD;  Location: [facility] ORS;  Service: Orthopedics;  Laterality: Right;  . KNEE CLOSED REDUCTION  09/13/2012   Procedure: CLOSED MANIPULATION KNEE;  Surgeon: Mauri Pole, MD;  Location: [facility] ORS;  Service: Orthopedics;  Laterality: Right;  . left knee arthroscopy     . left rotator cuff surgery       Prior to Admission medications   Medication Sig Start Date End Date Taking? Authorizing Provider  aspirin EC 81 MG tablet Take 81 mg by mouth daily.   Yes Historical Provider, MD  esomeprazole (NEXIUM) 40 MG capsule Take 40 mg by mouth daily at 12 noon.   Yes  Historical Provider, MD  lisinopril (PRINIVIL,ZESTRIL) 10 MG tablet Take 10 mg by mouth daily.   Yes Historical Provider, MD  oxyCODONE (OXY IR/ROXICODONE) 5 MG immediate release tablet Take 5 mg by mouth every 4 (four) hours as needed for severe pain.   Yes Historical Provider, MD  albuterol (PROVENTIL HFA;VENTOLIN HFA) 108 (90 Base) MCG/ACT inhaler Inhale 2 puffs into the lungs every 6 (six) hours as needed for wheezing or shortness of breath. 05/23/16   Lavonia Drafts, MD  predniSONE (DELTASONE) 50 MG tablet Take 1 tablet (50 mg total) by mouth daily with breakfast. 05/23/16   Lavonia Drafts, MD     Allergies Amoxicillin  No family history on file.  Social History Social History  Substance Use Topics  . Smoking status: Current Every Day Smoker    Packs/day: 0.50    Years: 45.00    Types: Cigarettes  . Smokeless tobacco: Never Used  . Alcohol use No    Review of Systems  Constitutional: No fever/chills Eyes: NoDischarge ENT: No sore throat. Cardiovascular: Denies chest pain. Respiratory:As above Gastrointestinal: No abdominal pain.  No nausea, no vomiting.   Genitourinary: Negative for dysuria. Musculoskeletal: Negative for back pain. Skin: Negative for rash. Neurological: Negative for headaches or weakness  10-point ROS otherwise negative.  ____________________________________________   PHYSICAL EXAM:  VITAL SIGNS: ED Triage Vitals  Enc Vitals Group     BP 05/23/16 0806 (!) 163/60  Pulse Rate 05/23/16 0806 61     Resp 05/23/16 0806 18     Temp 05/23/16 0806 97.6 F (36.4 C)     Temp Source 05/23/16 0806 Oral     SpO2 05/23/16 0806 96 %     Weight 05/23/16 0807 230 lb (104.3 kg)     Height 05/23/16 0807 6\' 1"  (1.854 m)     Head Circumference --      Peak Flow --      Pain Score 05/23/16 0807 8     Pain Loc --      Pain Edu? --      Excl. in [place]? --     Constitutional: Alert and oriented. No acute distress.  Eyes: Conjunctivae are normal.   Nose: No  congestion/rhinnorhea. Mouth/Throat: Mucous membranes are moist.    Cardiovascular: Normal rate, regular rhythm. Grossly normal heart sounds.  Good peripheral circulation. Respiratory: Normal respiratory effort.  No retractions.Wheezing diffusely Gastrointestinal: Soft and nontender. No distention.  No CVA tenderness. Genitourinary: deferred Musculoskeletal: No lower extremity tenderness nor edema.  Warm and well perfused Neurologic:  Normal speech and language. No gross focal neurologic deficits are appreciated.  Skin:  Skin is warm, dry and intact. No rash noted. Psychiatric: Mood and affect are normal. Speech and behavior are normal.  ____________________________________________   LABS (all labs ordered are listed, but only abnormal results are displayed)  Labs Reviewed  COMPREHENSIVE METABOLIC PANEL - Abnormal; Notable for the following:       Result Value   Glucose, Bld 100 (*)    All other components within normal limits  INFLUENZA PANEL BY PCR (TYPE A & B, H1N1)  CBC WITH DIFFERENTIAL/PLATELET  TROPONIN I  BRAIN NATRIURETIC PEPTIDE   ____________________________________________  EKG  None ____________________________________________  RADIOLOGY  Chest x-ray unremarkable ____________________________________________   PROCEDURES  Procedure(s) performed: No    Critical Care performed: No ____________________________________________   INITIAL IMPRESSION / ASSESSMENT AND PLAN / ED COURSE  Pertinent labs & imaging results that were available during my care of the patient were reviewed by me and considered in my medical decision making (see chart for details).  Patient presents with cough and malaise. Suspect upper respiratory infection, likely viral which has now caused COPD exacerbation, patient never been diagnosed with COPD before but given his diffuse wheezes and history of smoking this is quite suspicious. We will treat with Solu-Medrol, DuoNebs, check  labs, chest x-ray and reevaluate.  Clinical Course  Patient had significant improvement after treatment. He does continue to have scattered wheezes. Abdomen with tarry pulse ox satisfactory. We will discharge with Ventolin inhaler every 4-6 hours 2 puffs, and prednisone daily for 5 days. Strict return precautions. Follow-up with PCP for further diagnostics regarding likely COPD. ____________________________________________   FINAL CLINICAL IMPRESSION(S) / ED DIAGNOSES  Final diagnoses:  Viral upper respiratory tract infection  Bronchospasm      NEW MEDICATIONS STARTED DURING THIS VISIT:  Discharge Medication List as of 05/23/2016 12:22 PM    START taking these medications   Details  albuterol (PROVENTIL HFA;VENTOLIN HFA) 108 (90 Base) MCG/ACT inhaler Inhale 2 puffs into the lungs every 6 (six) hours as needed for wheezing or shortness of breath., Starting Fri 05/23/2016, Print    predniSONE (DELTASONE) 50 MG tablet Take 1 tablet (50 mg total) by mouth daily with breakfast., Starting Fri 05/23/2016, Print         Note:  This document was prepared using Dragon voice recognition software and may include unintentional  dictation errors.    Lavonia Drafts, MD 05/23/16 1250

## 2016-05-23 NOTE — ED Notes (Signed)
Patient ambulated to restroom without difficulty.

## 2016-05-23 NOTE — ED Notes (Signed)
Pt ambulated by Farley Ly, RN. Pt maintained O2 stat at 93%.

## 2017-03-18 ENCOUNTER — Emergency Department
Admission: EM | Admit: 2017-03-18 | Discharge: 2017-03-18 | Disposition: A | Discharge status: Home / Self Care | Payer: BC Managed Care – PPO | Attending: Student in an Organized Health Care Education/Training Program | Admitting: Student in an Organized Health Care Education/Training Program

## 2017-03-18 ENCOUNTER — Emergency Department: Payer: BC Managed Care – PPO

## 2017-03-18 ENCOUNTER — Encounter: Payer: Self-pay | Admitting: Radiology

## 2017-03-18 DIAGNOSIS — R1032 Left lower quadrant pain: Secondary | ICD-10-CM | POA: Diagnosis present

## 2017-03-18 DIAGNOSIS — N201 Calculus of ureter: Secondary | ICD-10-CM | POA: Insufficient documentation

## 2017-03-18 DIAGNOSIS — Z79899 Other long term (current) drug therapy: Secondary | ICD-10-CM | POA: Insufficient documentation

## 2017-03-18 DIAGNOSIS — F17210 Nicotine dependence, cigarettes, uncomplicated: Secondary | ICD-10-CM | POA: Insufficient documentation

## 2017-03-18 DIAGNOSIS — Z7982 Long term (current) use of aspirin: Secondary | ICD-10-CM | POA: Diagnosis not present

## 2017-03-18 DIAGNOSIS — I10 Essential (primary) hypertension: Secondary | ICD-10-CM | POA: Diagnosis not present

## 2017-03-18 LAB — URINALYSIS, COMPLETE (UACMP) WITH MICROSCOPIC
Bacteria, UA: NONE SEEN
Bilirubin Urine: NEGATIVE
Glucose, UA: NEGATIVE mg/dL
KETONES UR: NEGATIVE mg/dL
Leukocytes, UA: NEGATIVE
Nitrite: NEGATIVE
PH: 5 (ref 5.0–8.0)
PROTEIN: NEGATIVE mg/dL
Specific Gravity, Urine: >1.046 — ABNORMAL HIGH (ref 1.005–1.030)

## 2017-03-18 LAB — CBC WITH DIFFERENTIAL/PLATELET
BASOS ABS: 0.1 10*3/uL (ref 0–0.1)
Basophils Relative: 1 %
Eosinophils Absolute: 0.2 10*3/uL (ref 0–0.7)
Eosinophils Relative: 3 %
HEMATOCRIT: 43.3 % (ref 40.0–52.0)
Hemoglobin: 15 g/dL (ref 13.0–18.0)
LYMPHS ABS: 2.1 10*3/uL (ref 1.0–3.6)
LYMPHS PCT: 22 %
MCH: 31.2 pg (ref 26.0–34.0)
MCHC: 34.7 g/dL (ref 32.0–36.0)
MCV: 89.9 fL (ref 80.0–100.0)
Monocytes Absolute: 0.6 10*3/uL (ref 0.2–1.0)
Monocytes Relative: 7 %
NEUTROS PCT: 67 %
Neutro Abs: 6.4 10*3/uL (ref 1.4–6.5)
PLATELETS: 217 10*3/uL (ref 150–440)
RBC: 4.82 MIL/uL (ref 4.40–5.90)
RDW: 13.6 % (ref 11.5–14.5)
WBC: 9.6 10*3/uL (ref 3.8–10.6)

## 2017-03-18 LAB — COMPREHENSIVE METABOLIC PANEL
ALK PHOS: 67 U/L (ref 38–126)
ALT: 21 U/L (ref 17–63)
AST: 26 U/L (ref 15–41)
Albumin: 3.9 g/dL (ref 3.5–5.0)
Anion gap: 8 (ref 5–15)
BILIRUBIN TOTAL: 0.9 mg/dL (ref 0.3–1.2)
BUN: 13 mg/dL (ref 6–20)
CALCIUM: 8.9 mg/dL (ref 8.9–10.3)
CHLORIDE: 109 mmol/L (ref 101–111)
CO2: 25 mmol/L (ref 22–32)
Creatinine, Ser: 1 mg/dL (ref 0.61–1.24)
GFR calc Af Amer: >60 mL/min (ref >60)
Glucose, Bld: 119 mg/dL — ABNORMAL HIGH (ref 65–99)
Potassium: 3.5 mmol/L (ref 3.5–5.1)
Sodium: 142 mmol/L (ref 135–145)
Total Protein: 6.9 g/dL (ref 6.5–8.1)

## 2017-03-18 LAB — LIPASE, BLOOD: LIPASE: 24 U/L (ref 11–51)

## 2017-03-18 MED ORDER — IOPAMIDOL (ISOVUE-300) INJECTION 61%
100.0000 mL | Freq: Once | INTRAVENOUS | Status: AC | PRN
  Administered 2017-03-18: 100 mL via INTRAVENOUS

## 2017-03-18 MED ORDER — TAMSULOSIN HCL 0.4 MG PO CAPS: 0.4000 mg | ORAL_CAPSULE | Freq: Every day | ORAL | 0 refills | Status: DC

## 2017-03-18 MED ORDER — TAMSULOSIN HCL 0.4 MG PO CAPS
0.4000 mg | ORAL_CAPSULE | Freq: Once | ORAL | Status: AC
  Administered 2017-03-18: 0.4 mg via ORAL
  Filled 2017-03-18: qty 1

## 2017-03-18 MED ORDER — SODIUM CHLORIDE 0.9 % IV BOLUS (SEPSIS)
1000.0000 mL | Freq: Once | INTRAVENOUS | Status: AC
  Administered 2017-03-18: 1000 mL via INTRAVENOUS

## 2017-03-18 MED ORDER — ONDANSETRON HCL 4 MG PO TABS: 4.0000 mg | ORAL_TABLET | Freq: Every day | ORAL | 0 refills | Status: AC | PRN

## 2017-03-18 MED ORDER — FENTANYL CITRATE (PF) 100 MCG/2ML IJ SOLN
100.0000 ug | INTRAMUSCULAR | Status: DC | PRN
  Administered 2017-03-18: 100 ug via INTRAVENOUS
  Filled 2017-03-18: qty 2

## 2017-03-18 MED ORDER — MORPHINE SULFATE (PF) 4 MG/ML IV SOLN
4.0000 mg | INTRAVENOUS | Status: DC | PRN
Start: 2017-03-18 — End: 2017-03-18
  Administered 2017-03-18: 4 mg via INTRAVENOUS
  Filled 2017-03-18: qty 1

## 2017-03-18 MED ORDER — KETOROLAC TROMETHAMINE 30 MG/ML IJ SOLN
15.0000 mg | Freq: Once | INTRAMUSCULAR | Status: AC
  Administered 2017-03-18: 15 mg via INTRAVENOUS
  Filled 2017-03-18: qty 1

## 2017-03-18 MED ORDER — ONDANSETRON HCL 4 MG/2ML IJ SOLN
4.0000 mg | Freq: Once | INTRAMUSCULAR | Status: AC
  Administered 2017-03-18: 4 mg via INTRAVENOUS
  Filled 2017-03-18: qty 2

## 2017-03-18 MED ORDER — HYDROCODONE-ACETAMINOPHEN 5-325 MG PO TABS: 1.0000 | ORAL_TABLET | ORAL | 0 refills | Status: DC | PRN

## 2017-03-18 MED ORDER — HYDROCODONE-ACETAMINOPHEN 5-325 MG PO TABS
1.0000 | ORAL_TABLET | Freq: Once | ORAL | Status: AC
  Administered 2017-03-18: 1 via ORAL
  Filled 2017-03-18: qty 1

## 2017-03-18 NOTE — ED Triage Notes (Signed)
Pt in with co left sided abd pain that radiates to groin, states started yesterday evening. No vomiting but does have nausea.

## 2017-03-18 NOTE — ED Provider Notes (Signed)
[facility] Emergency Department Provider Note    First MD Initiated Contact with Patient 03/18/17 [phone]     (approximate)  I have reviewed the triage vital signs and the nursing notes.   HISTORY  Chief Complaint Abdominal Pain    HPI Mathew Adams is a 66 y.o. male presenting from home with chief complaint of acute onset left lower quadrant abdominal pain with pain radiating into his left groin and testicle. No vomiting but has had nausea. Patient rates the pain as 10/10. Patient unable to get comfortable lying in the bed. Denies any chest pain or shortness of breath. Does have a history kidney stones but has not had pain in the left side like this before.   Past Medical History:  Diagnosis Date  . Arthritis   . GERD (gastroesophageal reflux disease)   . Hypertension   . PONV (postoperative nausea and vomiting)    No family history on file. Past Surgical History:  Procedure Laterality Date  . COLONOSCOPY WITH PROPOFOL N/A 06/28/2015   Procedure: COLONOSCOPY WITH PROPOFOL;  Surgeon: Manya Silvas, MD;  Location: [facility] ENDOSCOPY;  Service: Endoscopy;  Laterality: N/A;  . JOINT REPLACEMENT     right knee replacement  . KNEE CLOSED REDUCTION  06/24/2012   Procedure: CLOSED MANIPULATION KNEE;  Surgeon: Mauri Pole, MD;  Location: [facility] ORS;  Service: Orthopedics;  Laterality: Right;  . KNEE CLOSED REDUCTION  09/13/2012   Procedure: CLOSED MANIPULATION KNEE;  Surgeon: Mauri Pole, MD;  Location: [facility] ORS;  Service: Orthopedics;  Laterality: Right;  . left knee arthroscopy     . left rotator cuff surgery      Patient Active Problem List   Diagnosis Date Noted  . S/P right knee manipulation 09/13/2012  . Overweight (BMI 25.0-29.9) 06/25/2012      Prior to Admission medications   Medication Sig Start Date End Date Taking? Authorizing Provider  albuterol (PROVENTIL HFA;VENTOLIN HFA) 108 (90 Base) MCG/ACT inhaler Inhale 2 puffs into the lungs  every 6 (six) hours as needed for wheezing or shortness of breath. 05/23/16   Lavonia Drafts, MD  aspirin EC 81 MG tablet Take 81 mg by mouth daily.    [provider]  esomeprazole (NEXIUM) 40 MG capsule Take 40 mg by mouth daily at 12 noon.    [provider]  HYDROcodone-acetaminophen (NORCO) 5-325 MG tablet Take 1 tablet by mouth every 4 (four) hours as needed for moderate pain. 03/18/17   Merlyn Lot, MD  lisinopril (PRINIVIL,ZESTRIL) 10 MG tablet Take 10 mg by mouth daily.    [provider]  ondansetron (ZOFRAN) 4 MG tablet Take 1 tablet (4 mg total) by mouth daily as needed for nausea or vomiting. 03/18/17 03/18/18  Merlyn Lot, MD  oxyCODONE (OXY IR/ROXICODONE) 5 MG immediate release tablet Take 5 mg by mouth every 4 (four) hours as needed for severe pain.    [provider]  predniSONE (DELTASONE) 50 MG tablet Take 1 tablet (50 mg total) by mouth daily with breakfast. 05/23/16   Lavonia Drafts, MD  tamsulosin (FLOMAX) 0.4 MG CAPS capsule Take 1 capsule (0.4 mg total) by mouth daily after supper. 03/18/17   Merlyn Lot, MD    Allergies Amoxicillin    Social History Social History  Substance Use Topics  . Smoking status: Current Every Day Smoker    Packs/day: 0.50    Years: 45.00    Types: Cigarettes  . Smokeless tobacco: Never Used  . Alcohol  use No    Review of Systems Patient denies headaches, rhinorrhea, blurry vision, numbness, shortness of breath, chest pain, edema, cough, abdominal pain, nausea, vomiting, diarrhea, dysuria, fevers, rashes or hallucinations unless otherwise stated above in HPI. ____________________________________________   PHYSICAL EXAM:  VITAL SIGNS: Vitals:   03/18/17 0344 03/18/17 0526  BP: (!) 164/70 137/70  Pulse: 63 (!) 58  Resp: 20 18  Temp: 98.2 F (36.8 C) 98.4 F (36.9 C)    Constitutional: Alert and oriented. Well appearing and in no acute distress. Eyes: Conjunctivae are normal.    Head: Atraumatic. Nose: No congestion/rhinnorhea. Mouth/Throat: Mucous membranes are moist.   Neck: No stridor. Painless ROM.  Cardiovascular: Normal rate, regular rhythm. Grossly normal heart sounds.  Good peripheral circulation. Respiratory: Normal respiratory effort.  No retractions. Lungs CTAB. Gastrointestinal: Soft with mild ttp left lq. No distention. No abdominal bruits. No CVA tenderness. Musculoskeletal: No lower extremity tenderness nor edema.  No joint effusions. Neurologic:  Normal speech and language. No gross focal neurologic deficits are appreciated. No facial droop Skin:  Skin is warm, dry and intact. No rash noted. Psychiatric: Mood and affect are normal. Speech and behavior are normal.  ____________________________________________   LABS (all labs ordered are listed, but only abnormal results are displayed)  Results for orders placed or performed during the hospital encounter of 03/18/17 (from the past 24 hour(s))  CBC with Differential/Platelet     Status: None   Collection Time: 03/18/17  4:07 AM  Result Value Ref Range   WBC 9.6 3.8 - 10.6 K/uL   RBC 4.82 4.40 - 5.90 MIL/uL   Hemoglobin 15.0 13.0 - 18.0 g/dL   HCT 43.3 40.0 - 52.0 %   MCV 89.9 80.0 - 100.0 fL   MCH 31.2 26.0 - 34.0 pg   MCHC 34.7 32.0 - 36.0 g/dL   RDW 13.6 11.5 - 14.5 %   Platelets 217 150 - 440 K/uL   Neutrophils Relative % 67 %   Neutro Abs 6.4 1.4 - 6.5 K/uL   Lymphocytes Relative 22 %   Lymphs Abs 2.1 1.0 - 3.6 K/uL   Monocytes Relative 7 %   Monocytes Absolute 0.6 0.2 - 1.0 K/uL   Eosinophils Relative 3 %   Eosinophils Absolute 0.2 0 - 0.7 K/uL   Basophils Relative 1 %   Basophils Absolute 0.1 0 - 0.1 K/uL  Comprehensive metabolic panel     Status: Abnormal   Collection Time: 03/18/17  4:07 AM  Result Value Ref Range   Sodium 142 135 - 145 mmol/L   Potassium 3.5 3.5 - 5.1 mmol/L   Chloride 109 101 - 111 mmol/L   CO2 25 22 - 32 mmol/L   Glucose, Bld 119 (H) 65 - 99 mg/dL    BUN 13 6 - 20 mg/dL   Creatinine, Ser 1.00 0.61 - 1.24 mg/dL   Calcium 8.9 8.9 - 10.3 mg/dL   Total Protein 6.9 6.5 - 8.1 g/dL   Albumin 3.9 3.5 - 5.0 g/dL   AST 26 15 - 41 U/L   ALT 21 17 - 63 U/L   Alkaline Phosphatase 67 38 - 126 U/L   Total Bilirubin 0.9 0.3 - 1.2 mg/dL   GFR calc non Af Amer >60 >60 mL/min   GFR calc Af Amer >60 >60 mL/min   Anion gap 8 5 - 15  Lipase, blood     Status: None   Collection Time: 03/18/17  4:07 AM  Result Value Ref Range  Lipase 24 11 - 51 U/L  Urinalysis, Complete w Microscopic     Status: Abnormal   Collection Time: 03/18/17  4:07 AM  Result Value Ref Range   Color, Urine YELLOW (A) YELLOW   APPearance CLEAR (A) CLEAR   Specific Gravity, Urine >1.046 (H) 1.005 - 1.030   pH 5.0 5.0 - 8.0   Glucose, UA NEGATIVE NEGATIVE mg/dL   Hgb urine dipstick LARGE (A) NEGATIVE   Bilirubin Urine NEGATIVE NEGATIVE   Ketones, ur NEGATIVE NEGATIVE mg/dL   Protein, ur NEGATIVE NEGATIVE mg/dL   Nitrite NEGATIVE NEGATIVE   Leukocytes, UA NEGATIVE NEGATIVE   RBC / HPF TOO NUMEROUS TO COUNT 0 - 5 RBC/hpf   WBC, UA 0-5 0 - 5 WBC/hpf   Bacteria, UA NONE SEEN NONE SEEN   Squamous Epithelial / LPF 0-5 (A) NONE SEEN   Mucous PRESENT    ____________________________________________ ____________________________________________  RADIOLOGY  I personally reviewed all radiographic images ordered to evaluate for the above acute complaints and reviewed radiology reports and findings.  These findings were personally discussed with the patient.  Please see medical record for radiology report.  ____________________________________________   PROCEDURES  Procedure(s) performed:  Procedures    Critical Care performed: no ____________________________________________   INITIAL IMPRESSION / ASSESSMENT AND PLAN / ED COURSE  Pertinent labs & imaging results that were available during my care of the patient were reviewed by me and considered in my medical decision  making (see chart for details).  DDX: stone, pyelo, hernia, diverticulitis, colitis, msk strain  MUHAMAD SERANO is a 66 y.o. who presents to the ED with Hx of stones p/w acute llq pain. No fevers, no systemic symptoms. - urinary symptoms. Denies trauma or injury. Afebrile in ED. Exam as above. Mild ttp of llq.  No guarding or rebound. No peritoneal signs. Possible kidney stone, diverticultiis, hernia  Checking urine. UA with hematuria, no infection CT with evidence fo distal ureteral stone with mild hydro on the left.   Clinical picture is not consistent with appendicitis, diverticulitis, pancreatitis, cholecystitis, bowel perforation, aortic dissection, splenic injury or acute abdominal process at this time.  ----------------------------------------- 6:11 AM on 03/18/2017 -----------------------------------------   Pain improved, tolerating PO. Repeat ABD exam benign, will plan supportive treatment and early follow up for recheck.       ____________________________________________   FINAL CLINICAL IMPRESSION(S) / ED DIAGNOSES  Final diagnoses:  LLQ abdominal pain  Ureterolithiasis      NEW MEDICATIONS STARTED DURING THIS VISIT:  New Prescriptions   HYDROCODONE-ACETAMINOPHEN (NORCO) 5-325 MG TABLET    Take 1 tablet by mouth every 4 (four) hours as needed for moderate pain.   ONDANSETRON (ZOFRAN) 4 MG TABLET    Take 1 tablet (4 mg total) by mouth daily as needed for nausea or vomiting.   TAMSULOSIN (FLOMAX) 0.4 MG CAPS CAPSULE    Take 1 capsule (0.4 mg total) by mouth daily after supper.     Note:  This document was prepared using Dragon voice recognition software and may include unintentional dictation errors.    Merlyn Lot, MD 03/18/17 [phone]

## 2017-03-20 ENCOUNTER — Emergency Department: Payer: BC Managed Care – PPO

## 2017-03-20 ENCOUNTER — Emergency Department
Admission: EM | Admit: 2017-03-20 | Discharge: 2017-03-20 | Disposition: A | Discharge status: Home / Self Care | Payer: BC Managed Care – PPO | Attending: Emergency Medicine | Admitting: Emergency Medicine

## 2017-03-20 DIAGNOSIS — I10 Essential (primary) hypertension: Secondary | ICD-10-CM | POA: Diagnosis not present

## 2017-03-20 DIAGNOSIS — N201 Calculus of ureter: Secondary | ICD-10-CM | POA: Diagnosis not present

## 2017-03-20 DIAGNOSIS — Z7982 Long term (current) use of aspirin: Secondary | ICD-10-CM | POA: Diagnosis not present

## 2017-03-20 DIAGNOSIS — F17210 Nicotine dependence, cigarettes, uncomplicated: Secondary | ICD-10-CM | POA: Insufficient documentation

## 2017-03-20 DIAGNOSIS — Z79899 Other long term (current) drug therapy: Secondary | ICD-10-CM | POA: Diagnosis not present

## 2017-03-20 DIAGNOSIS — R1031 Right lower quadrant pain: Secondary | ICD-10-CM | POA: Diagnosis present

## 2017-03-20 LAB — BASIC METABOLIC PANEL
Anion gap: 8 (ref 5–15)
BUN: 17 mg/dL (ref 6–20)
CO2: 24 mmol/L (ref 22–32)
Calcium: 9.2 mg/dL (ref 8.9–10.3)
Chloride: 107 mmol/L (ref 101–111)
Creatinine, Ser: 1.2 mg/dL (ref 0.61–1.24)
GFR calc Af Amer: >60 mL/min (ref >60)
GFR calc non Af Amer: >60 mL/min (ref >60)
GLUCOSE: 104 mg/dL — AB (ref 65–99)
POTASSIUM: 5.3 mmol/L — AB (ref 3.5–5.1)
Sodium: 139 mmol/L (ref 135–145)

## 2017-03-20 LAB — URINALYSIS, COMPLETE (UACMP) WITH MICROSCOPIC
BACTERIA UA: NONE SEEN
BILIRUBIN URINE: NEGATIVE
GLUCOSE, UA: NEGATIVE mg/dL
KETONES UR: NEGATIVE mg/dL
LEUKOCYTES UA: NEGATIVE
NITRITE: NEGATIVE
PH: 6 (ref 5.0–8.0)
Protein, ur: NEGATIVE mg/dL
Specific Gravity, Urine: 1.01 (ref 1.005–1.030)

## 2017-03-20 LAB — CBC
HEMATOCRIT: 41.7 % (ref 40.0–52.0)
Hemoglobin: 14.2 g/dL (ref 13.0–18.0)
MCH: 30.7 pg (ref 26.0–34.0)
MCHC: 34.1 g/dL (ref 32.0–36.0)
MCV: 90.1 fL (ref 80.0–100.0)
Platelets: 214 10*3/uL (ref 150–440)
RBC: 4.63 MIL/uL (ref 4.40–5.90)
RDW: 13.3 % (ref 11.5–14.5)
WBC: 9.7 10*3/uL (ref 3.8–10.6)

## 2017-03-20 MED ORDER — PROMETHAZINE HCL 25 MG/ML IJ SOLN
12.5000 mg | Freq: Once | INTRAMUSCULAR | Status: AC
  Administered 2017-03-20: 12.5 mg via INTRAVENOUS
  Filled 2017-03-20: qty 1

## 2017-03-20 MED ORDER — SODIUM CHLORIDE 0.9 % IV BOLUS (SEPSIS)
1000.0000 mL | Freq: Once | INTRAVENOUS | Status: AC
  Administered 2017-03-20: 1000 mL via INTRAVENOUS

## 2017-03-20 MED ORDER — FENTANYL CITRATE (PF) 100 MCG/2ML IJ SOLN
50.0000 ug | INTRAMUSCULAR | Status: DC | PRN
  Administered 2017-03-20: 50 ug via INTRAVENOUS
  Filled 2017-03-20: qty 2

## 2017-03-20 MED ORDER — OXYCODONE-ACETAMINOPHEN 5-325 MG PO TABS: 1.0000 | ORAL_TABLET | ORAL | 0 refills | Status: AC | PRN

## 2017-03-20 MED ORDER — PROMETHAZINE HCL 12.5 MG PO TABS: 12.5000 mg | ORAL_TABLET | Freq: Four times a day (QID) | ORAL | 0 refills | Status: DC | PRN

## 2017-03-20 MED ORDER — HYDROMORPHONE HCL 1 MG/ML IJ SOLN
1.0000 mg | Freq: Once | INTRAMUSCULAR | Status: AC
  Administered 2017-03-20: 1 mg via INTRAVENOUS
  Filled 2017-03-20: qty 1

## 2017-03-20 MED ORDER — ONDANSETRON HCL 4 MG/2ML IJ SOLN
4.0000 mg | Freq: Once | INTRAMUSCULAR | Status: AC
  Administered 2017-03-20: 4 mg via INTRAVENOUS
  Filled 2017-03-20: qty 2

## 2017-03-20 NOTE — ED Provider Notes (Signed)
[facility] Emergency Department Provider Note  ____________________________________________   First MD Initiated Contact with Patient 03/20/17 1335     (approximate)  I have reviewed the triage vital signs and the nursing notes.   HISTORY  Chief Complaint Flank Pain   HPI Mathew Adams is a 66 y.o. male who presents to the emergency department for evaluation and treatment of flank pain. He was evaluated here 2 days ago and diagnosed with kidney stones. He was told to schedule an appointment with his PCP or urology, but no one has any available appointments and he is hurting very bad. Not much relief with the prescribed medications. Dysuria is worse. No fever or other new symptoms of concern.    Past Medical History:  Diagnosis Date  . Arthritis   . GERD (gastroesophageal reflux disease)   . Hypertension   . PONV (postoperative nausea and vomiting)     Patient Active Problem List   Diagnosis Date Noted  . S/P right knee manipulation 09/13/2012  . Overweight (BMI 25.0-29.9) 06/25/2012    Past Surgical History:  Procedure Laterality Date  . COLONOSCOPY WITH PROPOFOL N/A 06/28/2015   Procedure: COLONOSCOPY WITH PROPOFOL;  Surgeon: Manya Silvas, MD;  Location: [facility] ENDOSCOPY;  Service: Endoscopy;  Laterality: N/A;  . JOINT REPLACEMENT     right knee replacement  . KNEE CLOSED REDUCTION  06/24/2012   Procedure: CLOSED MANIPULATION KNEE;  Surgeon: Mauri Pole, MD;  Location: [facility] ORS;  Service: Orthopedics;  Laterality: Right;  . KNEE CLOSED REDUCTION  09/13/2012   Procedure: CLOSED MANIPULATION KNEE;  Surgeon: Mauri Pole, MD;  Location: [facility] ORS;  Service: Orthopedics;  Laterality: Right;  . left knee arthroscopy     . left rotator cuff surgery       Prior to Admission medications   Medication Sig Start Date End Date Taking? Authorizing Provider  albuterol (PROVENTIL HFA;VENTOLIN HFA) 108 (90 Base) MCG/ACT inhaler Inhale 2 puffs into the  lungs every 6 (six) hours as needed for wheezing or shortness of breath. 05/23/16   Lavonia Drafts, MD  aspirin EC 81 MG tablet Take 81 mg by mouth daily.    [provider]  esomeprazole (NEXIUM) 40 MG capsule Take 40 mg by mouth daily at 12 noon.    [provider]  HYDROcodone-acetaminophen (NORCO) 5-325 MG tablet Take 1 tablet by mouth every 4 (four) hours as needed for moderate pain. 03/18/17   Merlyn Lot, MD  lisinopril (PRINIVIL,ZESTRIL) 10 MG tablet Take 10 mg by mouth daily.    [provider]  ondansetron (ZOFRAN) 4 MG tablet Take 1 tablet (4 mg total) by mouth daily as needed for nausea or vomiting. 03/18/17 03/18/18  Merlyn Lot, MD  oxyCODONE (OXY IR/ROXICODONE) 5 MG immediate release tablet Take 5 mg by mouth every 4 (four) hours as needed for severe pain.    [provider]  oxyCODONE-acetaminophen (ROXICET) 5-325 MG tablet Take 1 tablet by mouth every 4 (four) hours as needed for severe pain. 03/20/17   Addilee Neu, Johnette Abraham B, FNP  predniSONE (DELTASONE) 50 MG tablet Take 1 tablet (50 mg total) by mouth daily with breakfast. 05/23/16   Lavonia Drafts, MD  promethazine (PHENERGAN) 12.5 MG tablet Take 1 tablet (12.5 mg total) by mouth every 6 (six) hours as needed for nausea or vomiting. 03/20/17   Everleigh Colclasure B, FNP  tamsulosin (FLOMAX) 0.4 MG CAPS capsule Take 1 capsule (0.4 mg total) by mouth daily after supper. 03/18/17  Merlyn Lot, MD    Allergies Amoxicillin  No family history on file.  Social History Social History  Substance Use Topics  . Smoking status: Current Every Day Smoker    Packs/day: 0.50    Years: 45.00    Types: Cigarettes  . Smokeless tobacco: Never Used  . Alcohol use No    Review of Systems  Constitutional: No fever/chills Eyes: No visual changes. ENT: No sore throat. Cardiovascular: Denies chest pain. Respiratory: Denies shortness of breath. Gastrointestinal: No abdominal pain.  Positive for nausea  and vomiting.  No diarrhea.  No constipation. Genitourinary: Positive for dysuria. Musculoskeletal: Positive for left flank pain. Skin: Negative for rash. Neurological: Negative for headaches, focal weakness or numbness. ____________________________________________   PHYSICAL EXAM:  VITAL SIGNS: ED Triage Vitals  Enc Vitals Group     BP 03/20/17 1050 (!) 132/100     Pulse Rate 03/20/17 1050 69     Resp 03/20/17 1050 18     Temp 03/20/17 1050 98.7 F (37.1 C)     Temp Source 03/20/17 1050 Oral     SpO2 03/20/17 1050 100 %     Weight 03/20/17 1032 235 lb (106.6 kg)     Height 03/20/17 1032 6\' 1"  (1.854 m)     Head Circumference --      Peak Flow --      Pain Score 03/20/17 1032 10     Pain Loc --      Pain Edu? --      Excl. in [place]? --     Constitutional: Alert and oriented. Uncomfortable appearing and in no acute distress. Eyes: Conjunctivae are normal. Head: Atraumatic. Nose: No congestion/rhinnorhea. Mouth/Throat: Mucous membranes are moist.  Oropharynx non-erythematous. Neck: No stridor.   Cardiovascular: Normal rate, regular rhythm. Good peripheral circulation. Respiratory: Normal respiratory effort.  No retractions. Lungs CTAB. Gastrointestinal: Soft and nontender. No distention. No abdominal bruits. CVA tenderness on the left. Musculoskeletal: No lower extremity tenderness nor edema.  No joint effusions. Neurologic:  Normal speech and language. No gross focal neurologic deficits are appreciated. No gait instability. Skin:  Skin is warm, dry and intact. No rash noted. Psychiatric: Mood and affect are normal. Speech and behavior are normal.  ____________________________________________   LABS (all labs ordered are listed, but only abnormal results are displayed)  Labs Reviewed  URINALYSIS, COMPLETE (UACMP) WITH MICROSCOPIC - Abnormal; Notable for the following:       Result Value   Color, Urine YELLOW (*)    APPearance CLEAR (*)    Hgb urine dipstick MODERATE  (*)    Squamous Epithelial / LPF 0-5 (*)    All other components within normal limits  BASIC METABOLIC PANEL - Abnormal; Notable for the following:    Potassium 5.3 (*)    Glucose, Bld 104 (*)    All other components within normal limits  CBC   ____________________________________________  EKG  Not indicated. ____________________________________________  RADIOLOGY  US Renal  Result Date: 03/20/2017 CLINICAL DATA:  Left flank pain. EXAM: RENAL / URINARY TRACT ULTRASOUND COMPLETE COMPARISON:  CT scan of March 18, 2017. FINDINGS: Right Kidney: Length: 14.9 cm. Echogenicity within normal limits. No mass or hydronephrosis visualized. Left Kidney: Length: 13.1 cm. Echogenicity within normal limits. No mass visualized. Mild left hydronephrosis is noted without calculus seen. Bladder: Right ureteral jet is visualized. Left ureteral jet is not visualized. Bladder appears normal for degree of distension. IMPRESSION: Mild left hydronephrosis is noted consistent with obstruction secondary to distal left ureteral  calculus as described on prior CT scan. Electronically Signed   By: Marijo Conception, M.D.   On: 03/20/2017 15:55    CT Abdomen and Pelvis Results from 03/18/17:  IMPRESSION: 1. Mild left-sided hydronephrosis, with an obstructing 5 x 4 mm stone at the distal left ureter, just above the left vesicoureteral junction. 2. 6 mm nonobstructing stone at the lower pole of the right kidney. 3. Small right renal cyst. 4. Scattered aortic atherosclerosis. 5. Mildly enlarged prostate. ____________________________________________   PROCEDURES  Procedure(s) performed: None  Procedures  Critical Care performed: No  ____________________________________________   INITIAL IMPRESSION / ASSESSMENT AND PLAN / ED COURSE  Pertinent labs & imaging results that were available during my care of the patient were reviewed by me and considered in my medical decision making (see chart for  details).  66 year old male presenting to the emergency department for pain management secondary to a known 5x4 mm stone at the distal left ureter, just above the left vesicoureteral junction. He states that the pain has been unmanageable at home with the pain medications he was prescribed 2 days ago. [place] urology appointment is on 03/26/17 and he can not tolerate the pain.  ----------------------------------------- 2:50 PM on 03/20/2017 -----------------------------------------  Pain well controlled at this time. Plan to get renal US to evaluate amount of hydronephrosis and location of stone in comparison to CT from 2 days ago.   ----------------------------------------- 4:24 PM on 03/20/2017 ----------------------------------------- Case discussed with Dr. Junious Silk who will see the patient in his office next week. Patient's pain remains well controlled and he declined any further pain medications prior to discharge. He was advised to continue the Flomax and will be given roxicet and phenergan. He was advised to return to the ER for symptoms that change or worsen. ____________________________________________   FINAL CLINICAL IMPRESSION(S) / ED DIAGNOSES  Final diagnoses:  Ureterolithiasis      NEW MEDICATIONS STARTED DURING THIS VISIT:  New Prescriptions   OXYCODONE-ACETAMINOPHEN (ROXICET) 5-325 MG TABLET    Take 1 tablet by mouth every 4 (four) hours as needed for severe pain.   PROMETHAZINE (PHENERGAN) 12.5 MG TABLET    Take 1 tablet (12.5 mg total) by mouth every 6 (six) hours as needed for nausea or vomiting.     Note:  This document was prepared using Dragon voice recognition software and may include unintentional dictation errors.    Victorino Dike, FNP 03/20/17 1629    Delman Kitten, MD 03/21/17 1657

## 2017-03-20 NOTE — ED Notes (Signed)
Pt. Jud home with family.

## 2017-03-20 NOTE — ED Triage Notes (Signed)
Pt states he was seen here Wednesday with left flank, dx with kidney stone and referred to PCP or urology. Pt states they do not have any close opening and he cant wait that long due to the pain..states he has been having dry heaves.Marland Kitchen

## 2018-12-23 IMAGING — US US RENAL
1 series · 14 of 25 positions shown · non-contrast
Comparison: CT scan of March 18, 2017.

CLINICAL DATA: Left flank pain.

EXAM:
RENAL / URINARY TRACT ULTRASOUND COMPLETE

[Series 1: us renal · 0.27mm/px · 14 of 33 slices shown]
[im 1/33]
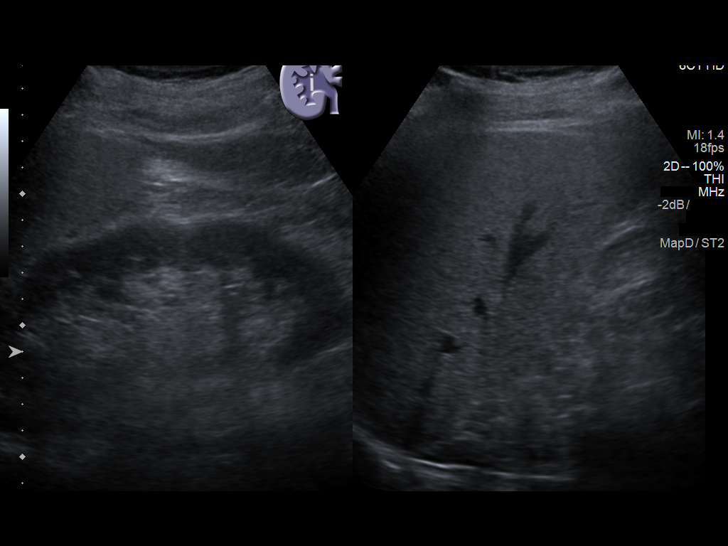
[im 3/33]
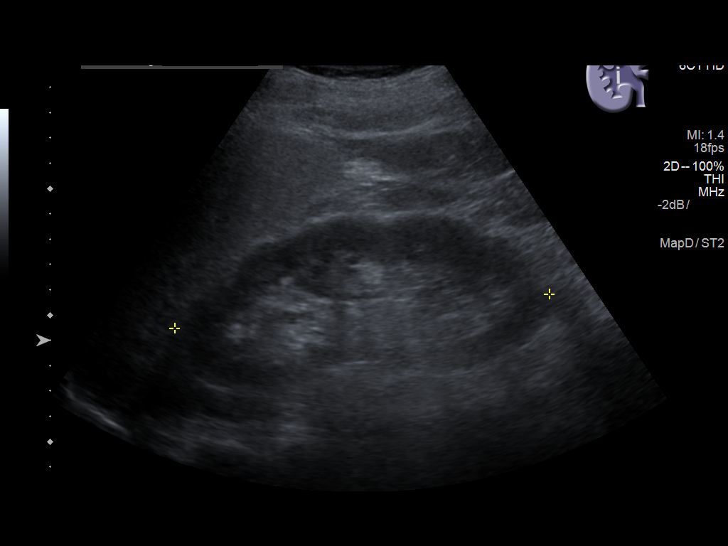
[im 6/33]
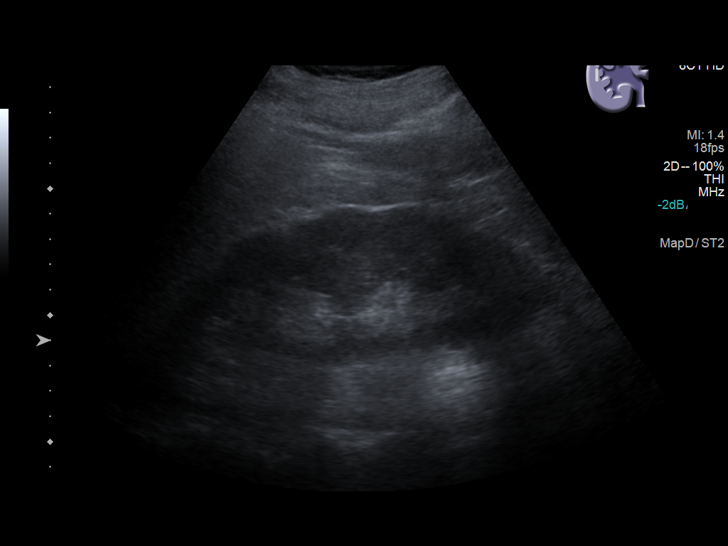
[im 9/33]
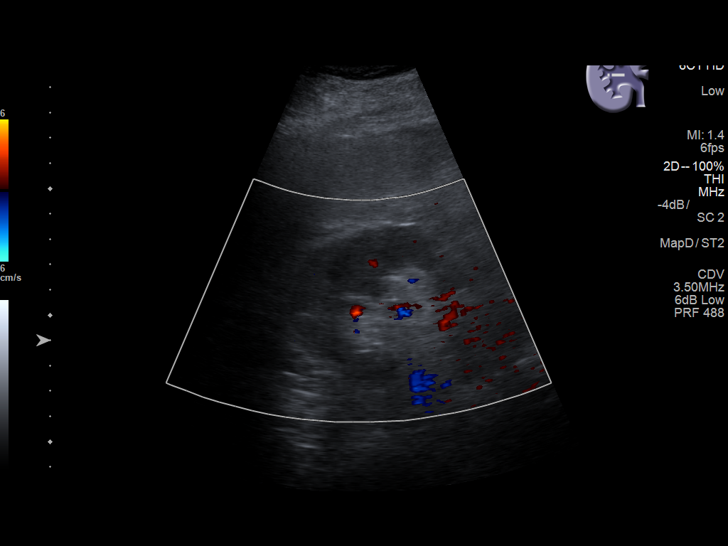
[im 11/33]
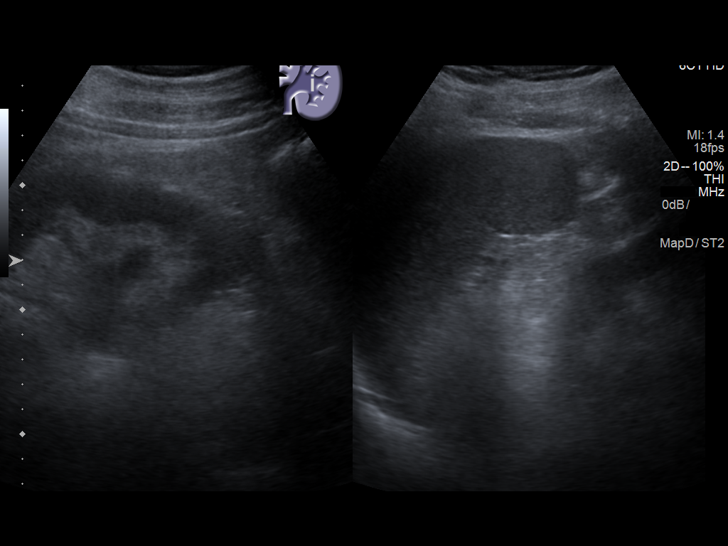
[im 13/33]
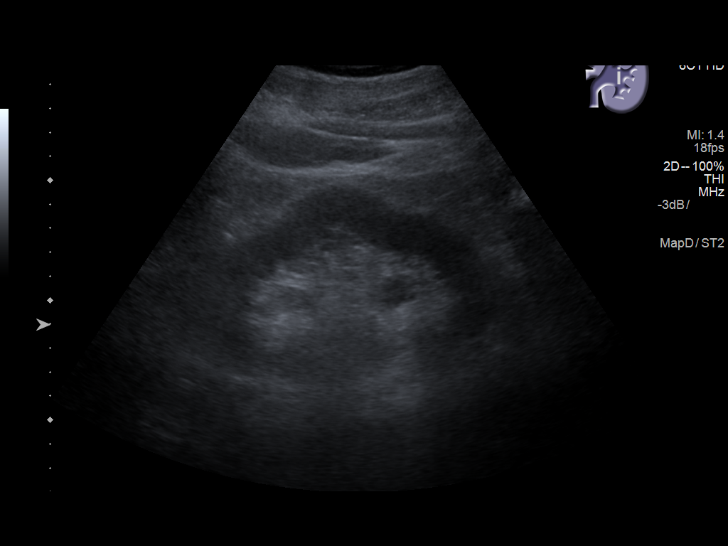
[im 15/33]
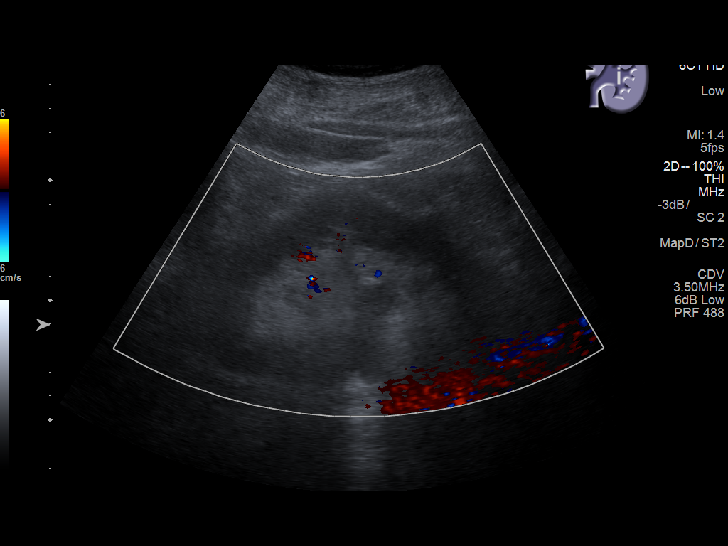
[im 18/33]
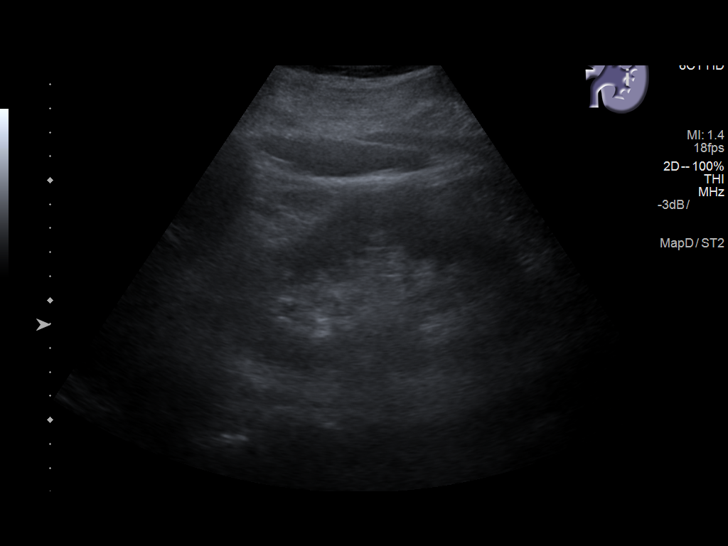
[im 21/33]
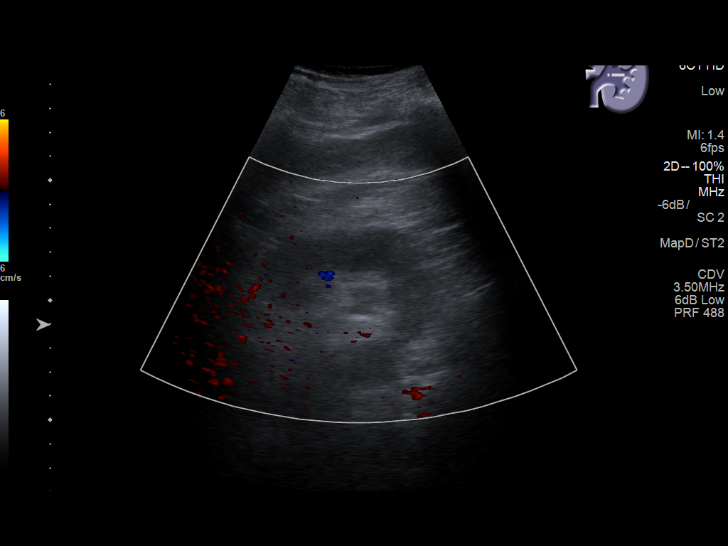
[im 22/33]
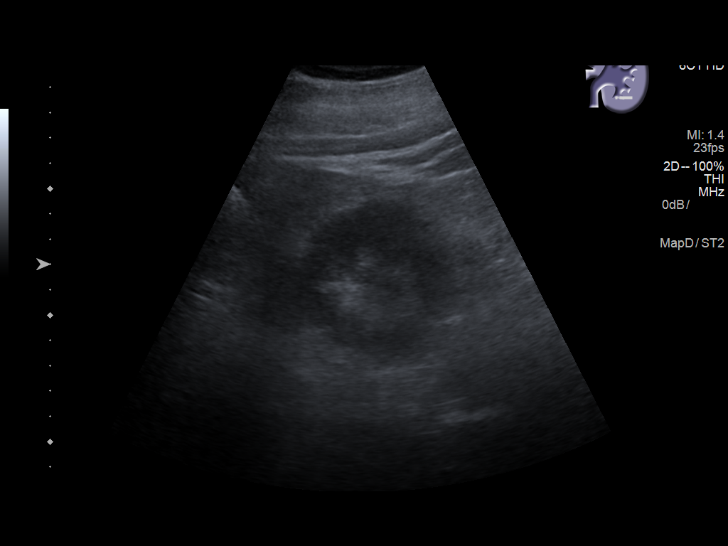
[im 25/33]
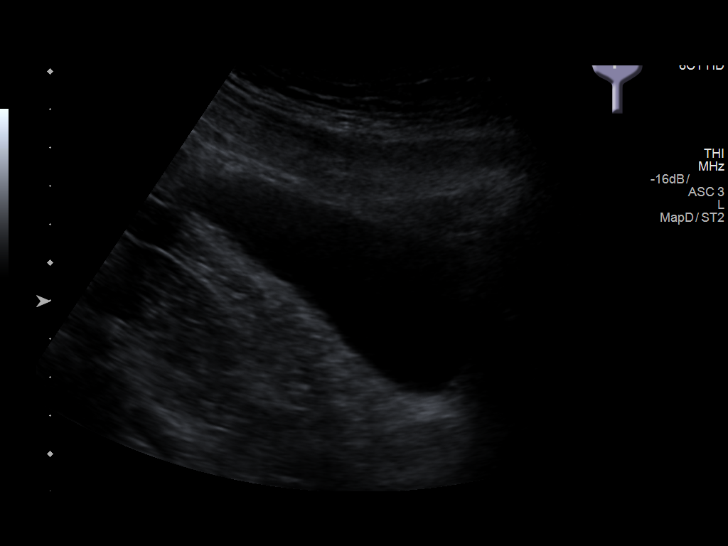
[im 27/33]
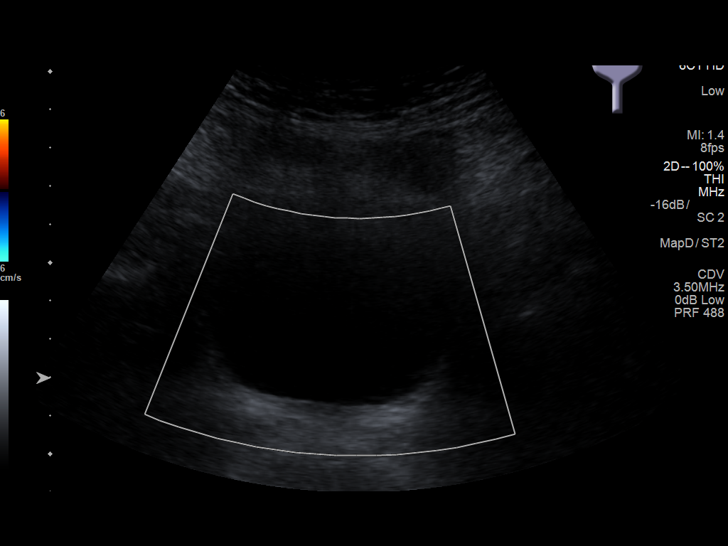
[im 30/33]
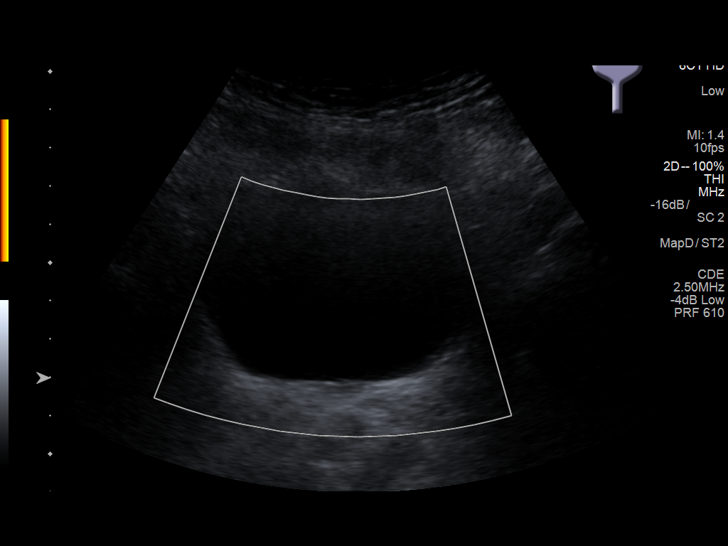
[im 33/33]
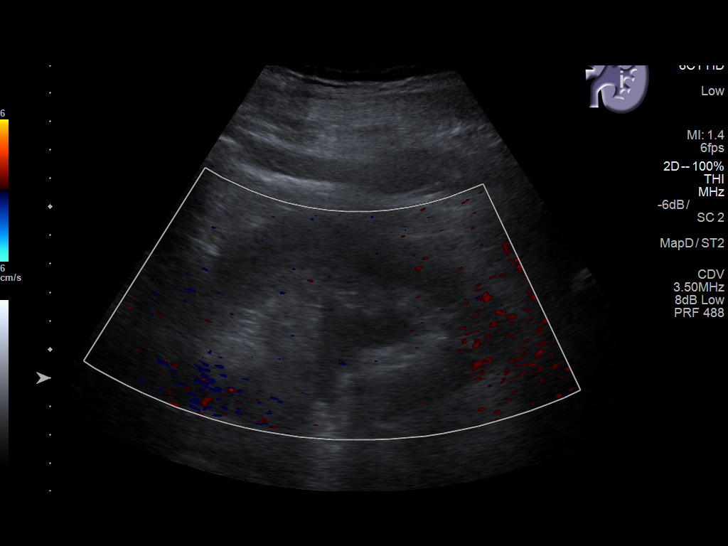

[14 of 25 positions shown; findings below may reference images not displayed]

FINDINGS: Right Kidney:

Length: 14.9 cm. Echogenicity within normal limits. No mass or
hydronephrosis visualized.

Left Kidney:

Length: 13.1 cm. Echogenicity within normal limits. No mass
visualized. Mild left hydronephrosis is noted without calculus seen.

Bladder:

Right ureteral jet is visualized. Left ureteral jet is not
visualized. Bladder appears normal for degree of distension.
IMPRESSION: Mild left hydronephrosis is noted consistent with obstruction
secondary to distal left ureteral calculus as described on prior CT
scan.

## 2022-04-29 ENCOUNTER — Encounter: Payer: Self-pay | Admitting: Emergency Medicine

## 2022-04-29 ENCOUNTER — Emergency Department: Payer: Medicare Other

## 2022-04-29 ENCOUNTER — Emergency Department
Admission: EM | Admit: 2022-04-29 | Discharge: 2022-04-29 | Disposition: A | Discharge status: Home / Self Care | Payer: Medicare Other | Attending: Emergency Medicine | Admitting: Emergency Medicine

## 2022-04-29 DIAGNOSIS — M25512 Pain in left shoulder: Secondary | ICD-10-CM | POA: Diagnosis present

## 2022-04-29 DIAGNOSIS — Z79899 Other long term (current) drug therapy: Secondary | ICD-10-CM | POA: Diagnosis not present

## 2022-04-29 DIAGNOSIS — Z7982 Long term (current) use of aspirin: Secondary | ICD-10-CM | POA: Diagnosis not present

## 2022-04-29 DIAGNOSIS — W19XXXA Unspecified fall, initial encounter: Secondary | ICD-10-CM | POA: Diagnosis not present

## 2022-04-29 DIAGNOSIS — I10 Essential (primary) hypertension: Secondary | ICD-10-CM | POA: Diagnosis not present

## 2022-04-29 DIAGNOSIS — Z96651 Presence of right artificial knee joint: Secondary | ICD-10-CM | POA: Insufficient documentation

## 2022-04-29 MED ORDER — HYDROCODONE-ACETAMINOPHEN 5-325 MG PO TABS: 1.0000 | ORAL_TABLET | Freq: Four times a day (QID) | ORAL | 0 refills | Status: AC | PRN

## 2022-04-29 MED ORDER — IBUPROFEN 800 MG PO TABS: 800.0000 mg | ORAL_TABLET | Freq: Three times a day (TID) | ORAL | 0 refills | Status: AC | PRN

## 2022-04-29 MED ORDER — KETOROLAC TROMETHAMINE 60 MG/2ML IM SOLN
30.0000 mg | Freq: Once | INTRAMUSCULAR | Status: AC
  Administered 2022-04-29: 30 mg via INTRAMUSCULAR
  Filled 2022-04-29: qty 2

## 2022-04-29 NOTE — ED Provider Notes (Signed)
[facility] Provider Note    Event Date/Time   First MD Initiated Contact with Patient 04/29/22 0321     (approximate)   History   Shoulder Injury   HPI  Mathew Adams is a 71 y.o. male who presents to the ED from home with persistent left shoulder pain status post fall last week.  He was seen at urgent care with negative x-rays.  His PCP was supposed to call in some pain medication but they did not.  Denies extremity weakness/numbness/tingling.  Voices no other complaints or injuries.  States right knee pain much improved.     Past Medical History   Past Medical History:  Diagnosis Date  . Arthritis   . GERD (gastroesophageal reflux disease)   . Hypertension   . PONV (postoperative nausea and vomiting)      Active Problem List   Patient Active Problem List   Diagnosis Date Noted  . S/P right knee manipulation 09/13/2012  . Overweight (BMI 25.0-29.9) 06/25/2012     Past Surgical History   Past Surgical History:  Procedure Laterality Date  . COLONOSCOPY WITH PROPOFOL N/A 06/28/2015   Procedure: COLONOSCOPY WITH PROPOFOL;  Surgeon: Manya Silvas, MD;  Location: [facility] ENDOSCOPY;  Service: Endoscopy;  Laterality: N/A;  . JOINT REPLACEMENT     right knee replacement  . KNEE CLOSED REDUCTION  06/24/2012   Procedure: CLOSED MANIPULATION KNEE;  Surgeon: Mauri Pole, MD;  Location: [facility] ORS;  Service: Orthopedics;  Laterality: Right;  . KNEE CLOSED REDUCTION  09/13/2012   Procedure: CLOSED MANIPULATION KNEE;  Surgeon: Mauri Pole, MD;  Location: [facility] ORS;  Service: Orthopedics;  Laterality: Right;  . left knee arthroscopy     . left rotator cuff surgery        Home Medications   Prior to Admission medications   Medication Sig Start Date End Date Taking? Authorizing Provider  albuterol (PROVENTIL HFA;VENTOLIN HFA) 108 (90 Base) MCG/ACT inhaler Inhale 2 puffs into the lungs every 6 (six) hours as needed for wheezing or shortness of  breath. 05/23/16   Lavonia Drafts, MD  aspirin EC 81 MG tablet Take 81 mg by mouth daily.    [provider]  esomeprazole (NEXIUM) 40 MG capsule Take 40 mg by mouth daily at 12 noon.    [provider]  HYDROcodone-acetaminophen (NORCO) 5-325 MG tablet Take 1 tablet by mouth every 4 (four) hours as needed for moderate pain. 03/18/17   Merlyn Lot, MD  lisinopril (PRINIVIL,ZESTRIL) 10 MG tablet Take 10 mg by mouth daily.    [provider]  oxyCODONE (OXY IR/ROXICODONE) 5 MG immediate release tablet Take 5 mg by mouth every 4 (four) hours as needed for severe pain.    [provider]  oxyCODONE-acetaminophen (ROXICET) 5-325 MG tablet Take 1 tablet by mouth every 4 (four) hours as needed for severe pain. 03/20/17   Triplett, Johnette Abraham B, FNP  predniSONE (DELTASONE) 50 MG tablet Take 1 tablet (50 mg total) by mouth daily with breakfast. 05/23/16   Lavonia Drafts, MD  promethazine (PHENERGAN) 12.5 MG tablet Take 1 tablet (12.5 mg total) by mouth every 6 (six) hours as needed for nausea or vomiting. 03/20/17   Triplett, Cari B, FNP  tamsulosin (FLOMAX) 0.4 MG CAPS capsule Take 1 capsule (0.4 mg total) by mouth daily after supper. 03/18/17   Merlyn Lot, MD     Allergies  Amoxicillin   Family History  History reviewed. No pertinent family history.  Physical Exam  Triage Vital Signs: ED Triage Vitals  Enc Vitals Group     BP 04/29/22 0031 (!) 167/66     Pulse Rate 04/29/22 0031 86     Resp 04/29/22 0031 18     Temp 04/29/22 0031 98.3 F (36.8 C)     Temp src --      SpO2 04/29/22 0031 98 %     Weight 04/29/22 0031 225 lb (102.1 kg)     Height 04/29/22 0031 '6\' 1"'$  (1.854 m)     Head Circumference --      Peak Flow --      Pain Score 04/29/22 0030 7     Pain Loc --      Pain Edu? --      Excl. in [place]? --     Updated Vital Signs: BP (!) 167/66 (BP Location: Right Arm)   Pulse 86   Temp 98.3 F (36.8 C)   Resp 18   Ht '6\' 1"'$  (1.854 m)   Wt  102.1 kg   SpO2 98%   BMI 29.69 kg/m    General: Awake, mild distress.  CV:  RRR.  Good peripheral perfusion.  Resp:  Normal effort.  CTA B. Abd:  Nontender.  No distention.  Other:  Left shoulder: Posterior aspect tender to palpation.  Limited range of motion secondary to pain.  2+ radial pulses.  Brisk, less than 5-second cap refill.   ED Results / Procedures / Treatments  Labs (all labs ordered are listed, but only abnormal results are displayed) Labs Reviewed - No data to display   EKG  None   RADIOLOGY I have independently visualized and interpreted patient's x-ray as well as noted the radiology interpretation:  Left shoulder x-ray: No acute fracture or dislocation  Official radiology report(s): DG Shoulder Left  Result Date: 04/29/2022 CLINICAL DATA:  Fall, left shoulder pain EXAM: LEFT SHOULDER - 2+ VIEW COMPARISON:  None Available. FINDINGS: Normal alignment. No acute fracture or dislocation. Acromioclavicular joint space is not well profiled. Glenohumeral joint space is not optimally profiled, however, at least moderate degenerative arthritis is noted with asymmetric joint space narrowing and osteophyte formation. Limited evaluation of the left hemithorax is unremarkable. IMPRESSION: 1. No acute fracture or dislocation. 2. Moderate glenohumeral degenerative arthritis. Electronically Signed   By: Fidela Salisbury M.D.   On: 04/29/2022 04:18     PROCEDURES:  Critical Care performed: No  Procedures   MEDICATIONS ORDERED IN ED: Medications  ketorolac (TORADOL) injection 30 mg (30 mg Intramuscular Given 04/29/22 0353)     IMPRESSION / MDM / ASSESSMENT AND PLAN / ED COURSE  I reviewed the triage vital signs and the nursing notes.                             71 year old male presenting with persistent left shoulder pain status post fall 1 week ago.  Patient's presentation is most consistent with acute, uncomplicated illness.  3329 X-ray negative for acute  osseous injury.  Will place in sling, discharged on NSAIDs and analgesia, and patient will follow-up with orthopedics.  Strict return precautions given.  Patient verbalizes understanding agrees with plan of care.  FINAL CLINICAL IMPRESSION(S) / ED DIAGNOSES   Final diagnoses:  Acute pain of left shoulder     Rx / DC Orders   ED Discharge Orders     None        Note:  This document  was prepared using Systems analyst and may include unintentional dictation errors.   Paulette Blanch, MD 04/29/22 [phone]

## 2022-04-29 NOTE — ED Triage Notes (Signed)
Pt presents via POV with complaints of left shoulder pain & right knee pain following a fall on Thursday last week. Pt was seen at [facility] with [facility] and was told they would follow up with him but he hasnt heard anything from the imaging - nor was he given any pain meds. Pt took advil about 1.5 hours ago without any improvement in his sx. Denies CP or SOB.     Pt was seen at [place] [facility] where they did Xrs of his shoulder & Knee which stated:   IMPRESSION:  Severe osteoarthrosis of the left glenohumeral joint without acute osseous abnormality.  FINDINGS:  Cemented total knee arthroplasty. There is a thin lucency at the bone cement interface of the medial tibial tray with sclerotic margins and favored to be chronic. No pathologic perihardware lucency. No fracture. Small well-corticated and chronic ossific bodies at the medial aspect of the knee. Trace knee effusion

## 2022-04-29 NOTE — ED Notes (Signed)
Patient discharged at this time. Ambulated to lobby with independent and steady gait. Breathing unlabored speaking in full sentences. Verbalized understanding of all discharge, follow up, and medication teaching. Discharged homed with all belongings.   

## 2022-04-29 NOTE — Discharge Instructions (Signed)
You may take pain medicines as needed.  Wear sling as needed for comfort.  Return to the ER for worsening symptoms, persistent vomiting, difficulty breathing or other concerns.

## 2022-07-18 ENCOUNTER — Other Ambulatory Visit: Payer: Self-pay

## 2022-07-18 ENCOUNTER — Encounter: Payer: Self-pay | Admitting: Emergency Medicine

## 2022-07-18 ENCOUNTER — Emergency Department
Admission: EM | Admit: 2022-07-18 | Discharge: 2022-07-18 | Disposition: A | Discharge status: Home / Self Care | Payer: Medicare Other | Attending: Emergency Medicine | Admitting: Emergency Medicine

## 2022-07-18 DIAGNOSIS — R112 Nausea with vomiting, unspecified: Secondary | ICD-10-CM | POA: Diagnosis present

## 2022-07-18 DIAGNOSIS — R197 Diarrhea, unspecified: Secondary | ICD-10-CM | POA: Diagnosis not present

## 2022-07-18 DIAGNOSIS — I10 Essential (primary) hypertension: Secondary | ICD-10-CM | POA: Insufficient documentation

## 2022-07-18 LAB — CBC WITH DIFFERENTIAL/PLATELET
Abs Immature Granulocytes: 0.05 10*3/uL (ref 0.00–0.07)
Basophils Absolute: 0 10*3/uL (ref 0.0–0.1)
Basophils Relative: 0 %
Eosinophils Absolute: 0.1 10*3/uL (ref 0.0–0.5)
Eosinophils Relative: 1 %
HCT: 42.7 % (ref 39.0–52.0)
Hemoglobin: 14.3 g/dL (ref 13.0–17.0)
Immature Granulocytes: 1 %
Lymphocytes Relative: 6 %
Lymphs Abs: 0.6 10*3/uL — ABNORMAL LOW (ref 0.7–4.0)
MCH: 30.6 pg (ref 26.0–34.0)
MCHC: 33.5 g/dL (ref 30.0–36.0)
MCV: 91.2 fL (ref 80.0–100.0)
Monocytes Absolute: 0.6 10*3/uL (ref 0.1–1.0)
Monocytes Relative: 6 %
Neutro Abs: 8.4 10*3/uL — ABNORMAL HIGH (ref 1.7–7.7)
Neutrophils Relative %: 86 %
Platelets: 111 10*3/uL — ABNORMAL LOW (ref 150–400)
RBC: 4.68 MIL/uL (ref 4.22–5.81)
RDW: 13.4 % (ref 11.5–15.5)
WBC: 9.8 10*3/uL (ref 4.0–10.5)
nRBC: 0 % (ref 0.0–0.2)

## 2022-07-18 LAB — COMPREHENSIVE METABOLIC PANEL
ALT: 22 U/L (ref 0–44)
AST: 25 U/L (ref 15–41)
Albumin: 3.9 g/dL (ref 3.5–5.0)
Alkaline Phosphatase: 58 U/L (ref 38–126)
Anion gap: 5 (ref 5–15)
BUN: 18 mg/dL (ref 8–23)
CO2: 26 mmol/L (ref 22–32)
Calcium: 8.5 mg/dL — ABNORMAL LOW (ref 8.9–10.3)
Chloride: 106 mmol/L (ref 98–111)
Creatinine, Ser: 0.92 mg/dL (ref 0.61–1.24)
GFR, Estimated: >60 mL/min (ref >60)
Glucose, Bld: 123 mg/dL — ABNORMAL HIGH (ref 70–99)
Potassium: 3.5 mmol/L (ref 3.5–5.1)
Sodium: 137 mmol/L (ref 135–145)
Total Bilirubin: 1.3 mg/dL — ABNORMAL HIGH (ref 0.3–1.2)
Total Protein: 7 g/dL (ref 6.5–8.1)

## 2022-07-18 LAB — URINALYSIS, ROUTINE W REFLEX MICROSCOPIC
Bacteria, UA: NONE SEEN
Bilirubin Urine: NEGATIVE
Glucose, UA: NEGATIVE mg/dL
Hgb urine dipstick: NEGATIVE
Ketones, ur: NEGATIVE mg/dL
Leukocytes,Ua: NEGATIVE
Nitrite: NEGATIVE
Protein, ur: 30 mg/dL — AB
Specific Gravity, Urine: 1.025 (ref 1.005–1.030)
pH: 5 (ref 5.0–8.0)

## 2022-07-18 LAB — TROPONIN I (HIGH SENSITIVITY)
Troponin I (High Sensitivity): 5 ng/L (ref <18)
Troponin I (High Sensitivity): 4 ng/L (ref <18)

## 2022-07-18 LAB — LIPASE, BLOOD: Lipase: 26 U/L (ref 11–51)

## 2022-07-18 MED ORDER — LACTATED RINGERS IV BOLUS
1000.0000 mL | Freq: Once | INTRAVENOUS | Status: AC
  Administered 2022-07-18: 1000 mL via INTRAVENOUS

## 2022-07-18 MED ORDER — LOPERAMIDE HCL 2 MG PO TABS: 2.0000 mg | ORAL_TABLET | Freq: Three times a day (TID) | ORAL | 0 refills | Status: AC | PRN

## 2022-07-18 MED ORDER — ONDANSETRON HCL 4 MG/2ML IJ SOLN
4.0000 mg | Freq: Once | INTRAMUSCULAR | Status: AC
  Administered 2022-07-18: 4 mg via INTRAVENOUS
  Filled 2022-07-18: qty 2

## 2022-07-18 MED ORDER — LOPERAMIDE HCL 2 MG PO TABS: 2.0000 mg | ORAL_TABLET | Freq: Three times a day (TID) | ORAL | 0 refills | Status: DC | PRN

## 2022-07-18 MED ORDER — ONDANSETRON 4 MG PO TBDP: 4.0000 mg | ORAL_TABLET | Freq: Three times a day (TID) | ORAL | 0 refills | Status: DC | PRN

## 2022-07-18 NOTE — Discharge Instructions (Addendum)
-  I suspect the symptoms are likely from a viral gastroenteritis, most likely from the food you recently ate.  Decadron as needed for nausea/vomiting.  The loperamide can be taken after every loose stool to help manage the diarrhea.  -If your diarrhea persist beyond 5 to 7 days, please return to the emergency department or follow-up with your regular doctor.  -Return to the emergency department anytime if you begin to experience any new or worsening symptoms, particularly any significant abdominal pain.

## 2022-07-18 NOTE — ED Provider Notes (Signed)
[facility] Provider Note    Event Date/Time   First MD Initiated Contact with Patient 07/18/22 [phone]     (approximate)   History   Chief Complaint Emesis   HPI Mathew Adams is a 71 y.o. male, history of GERD, arthritis, hypertension, presents to the emergency department for evaluation of emesis.  Patient states that the night before last, he was at a [place] where he ate some chicken and felt like was undercooked and left more "pink than usual".  Within 24 hours, he began to develop several episodes of emesis and diarrhea.  He states that he has "lost count" of the exact number of times.  Reports some generalized abdominal cramping, and chest discomfort secondary to the vomiting/dry heaving.  Otherwise no significant pain.  Denies any recent antibiotic use or recent travel.  Denies fever/chills, shortness of breath, flank pain, hematemesis, hematochezia, dysuria, headache, or dizziness/lightheadedness.  History Limitations: No limitations.        Physical Exam  Triage Vital Signs: ED Triage Vitals  Enc Vitals Group     BP 07/18/22 0542 (!) 150/66     Pulse Rate 07/18/22 0542 93     Resp 07/18/22 0542 18     Temp 07/18/22 0542 98.5 F (36.9 C)     Temp Source 07/18/22 0542 Oral     SpO2 07/18/22 0542 97 %     Weight --      Height --      Head Circumference --      Peak Flow --      Pain Score 07/18/22 0541 10     Pain Loc --      Pain Edu? --      Excl. in [place]? --     Most recent vital signs: Vitals:   07/18/22 0542 07/18/22 0853  BP: (!) 150/66 (!) 148/70  Pulse: 93 80  Resp: 18 18  Temp: 98.5 F (36.9 C)   SpO2: 97%     General: Awake, NAD.  Skin: Warm, dry. No rashes or lesions.  Eyes: PERRL. Conjunctivae normal.  CV: Good peripheral perfusion.  Resp: Normal effort.  Abd: Soft, non-tender. No distention.  Negative CVAT. Neuro: At baseline. No gross neurological deficits.  Musculoskeletal: Normal ROM of all  extremities.  Physical Exam    ED Results / Procedures / Treatments  Labs (all labs ordered are listed, but only abnormal results are displayed) Labs Reviewed  CBC WITH DIFFERENTIAL/PLATELET - Abnormal; Notable for the following components:      Result Value   Platelets 111 (*)    Neutro Abs 8.4 (*)    Lymphs Abs 0.6 (*)    All other components within normal limits  COMPREHENSIVE METABOLIC PANEL - Abnormal; Notable for the following components:   Glucose, Bld 123 (*)    Calcium 8.5 (*)    Total Bilirubin 1.3 (*)    All other components within normal limits  URINALYSIS, ROUTINE W REFLEX MICROSCOPIC - Abnormal; Notable for the following components:   Color, Urine YELLOW (*)    APPearance CLEAR (*)    Protein, ur 30 (*)    All other components within normal limits  LIPASE, BLOOD  TROPONIN I (HIGH SENSITIVITY)  TROPONIN I (HIGH SENSITIVITY)     EKG Sinus rhythm with arrhythmia, rate of 91, no T-segment changes, normal QRS, no QT prolongation, left axis deviation present.    RADIOLOGY  ED Provider Interpretation: N/A.  No results found.  PROCEDURES:  Critical Care performed: N/A.  Procedures    MEDICATIONS ORDERED IN ED: Medications  ondansetron (ZOFRAN) injection 4 mg (4 mg Intravenous Given 07/18/22 0747)  lactated ringers bolus 1,000 mL (1,000 mLs Intravenous New Bag/Given 07/18/22 0748)     IMPRESSION / MDM / ASSESSMENT AND PLAN / ED COURSE  I reviewed the triage vital signs and the nursing notes.                              Differential diagnosis includes, but is not limited to, gastritis, gastroenteritis, ureterolithiasis, cystitis/pyelonephritis, C. difficile,  ED Course Patient appears well, vitals within normal limits.  NAD.  Currently nursing mild nausea at this time.  Will treat with IV fluids and ondansetron.  CBC shows no leukocytosis or anemia.  Notable thrombocytopenia with platelet level of 111.  CMP shows no electrolyte abnormalities,  AKI, or transaminitis.  Urinalysis shows no evidence of infection.  EKG unremarkable.  Initial troponin 5.  Second troponin 4.  Assessment/Plan Presentation is consistent with gastroenteritis, likely secondary from the possible uncooked food he consumed 24 hours prior.  He states that after ondansetron and IV fluids, he feels much better.  Not noticing any significant Donnell pain at this time.  I do not believe that CT imaging would be high yield in this circumstance, however given his age we did discuss the risks and the benefits of performing CT imaging.  With shared decision making, we agreed to defer for now.  His lab workup is reassuring.  Will provide him with a prescription for ondansetron and loperamide to help manage his symptoms.  I did advise him that if his symptoms persist beyond 5 to 7 days, that he should return here or follow-up with his primary care provider.  I did advise him that if anytime he experiences any acute worsening of his symptoms, particularly abdominal pain, to return immediately.  He expressed understanding and agreed.  Will discharge.  Considered admission for this patient, but given her stable presentation and positive response from treatment, he is unlikely to benefit from admission.  Provided the patient with anticipatory guidance, return precautions, and educational material. Encouraged the patient to return to the emergency department at any time if they begin to experience any new or worsening symptoms. Patient expressed understanding and agreed with the plan.   Patient's presentation is most consistent with acute complicated illness / injury requiring diagnostic workup.       FINAL CLINICAL IMPRESSION(S) / ED DIAGNOSES   Final diagnoses:  Nausea vomiting and diarrhea     Rx / DC Orders   ED Discharge Orders          Ordered    ondansetron (ZOFRAN-ODT) 4 MG disintegrating tablet  Every 8 hours PRN,   Status:  Discontinued        07/18/22 0855     loperamide (IMODIUM A-D) 2 MG tablet  3 times daily PRN,   Status:  Discontinued        07/18/22 0855    ondansetron (ZOFRAN-ODT) 4 MG disintegrating tablet  Every 8 hours PRN        07/18/22 0856    loperamide (IMODIUM A-D) 2 MG tablet  3 times daily PRN        07/18/22 0856             Note:  This document was prepared using Dragon voice recognition software and may include unintentional dictation errors.  Teodoro Spray, [place] 07/18/22 0086    Naaman Plummer, MD 07/18/22 1056

## 2022-07-18 NOTE — ED Triage Notes (Signed)
Patient ambulatory to triage with steady gait, without difficulty or distress noted; pt reports N/V/D since yesterday accomp by generalized abd/chest pain

## 2022-07-18 NOTE — ED Notes (Signed)
Pt attempted to sign for education and d/c paperwork but topaz not working. Pt states feels much better than when he came in and states will take his BP med when he gets home.

## 2023-07-13 ENCOUNTER — Other Ambulatory Visit: Payer: Self-pay | Admitting: Family Medicine

## 2023-07-13 DIAGNOSIS — Z122 Encounter for screening for malignant neoplasm of respiratory organs: Secondary | ICD-10-CM

## 2024-03-14 ENCOUNTER — Encounter: Payer: Self-pay | Admitting: Urology

## 2024-03-14 ENCOUNTER — Ambulatory Visit (INDEPENDENT_AMBULATORY_CARE_PROVIDER_SITE_OTHER): Admitting: Urology

## 2024-03-14 VITALS — BP 136/64 | HR 61 | Ht 73.0 in | Wt 242.0 lb

## 2024-03-14 DIAGNOSIS — R972 Elevated prostate specific antigen [PSA]: Secondary | ICD-10-CM | POA: Diagnosis not present

## 2024-03-14 LAB — URINALYSIS, COMPLETE
Bilirubin, UA: NEGATIVE
Glucose, UA: NEGATIVE
Ketones, UA: NEGATIVE
Leukocytes,UA: NEGATIVE
Nitrite, UA: NEGATIVE
Protein,UA: NEGATIVE
RBC, UA: NEGATIVE
Specific Gravity, UA: 1.015 (ref 1.005–1.030)
Urobilinogen, Ur: 1 mg/dL (ref 0.2–1.0)
pH, UA: 6 (ref 5.0–7.5)

## 2024-03-14 LAB — MICROSCOPIC EXAMINATION

## 2024-03-14 NOTE — Progress Notes (Signed)
 03/14/2024 12:53 PM   Mathew Adams August 28, 1950 [id number]  Referring provider: Donal Channing SQUIBB, FNP [address] [place],  [place] [address]  Chief Complaint  Patient presents with   Establish Care   Elevated PSA    HPI: Mathew Adams is a 73 y.o. male referred for evaluation of an elevated PSA.  PSA levels: 28.1 (% free 5.88) 01/12/2024 Baseline PSA level: Not available Lower urinary tract symptoms: None Family history prostate cancer first-degree relatives: None Past urologic history: Stone disease  PMH: Past Medical History:  Diagnosis Date   Arthritis    GERD (gastroesophageal reflux disease)    Hypertension    PONV (postoperative nausea and vomiting)     Surgical History: Past Surgical History:  Procedure Laterality Date   COLONOSCOPY WITH PROPOFOL  N/A 06/28/2015   Procedure: COLONOSCOPY WITH PROPOFOL ;  Surgeon: Mathew ONEIDA Holmes, MD;  Location: [facility] ENDOSCOPY;  Service: Endoscopy;  Laterality: N/A;   JOINT REPLACEMENT     right knee replacement   KNEE CLOSED REDUCTION  06/24/2012   Procedure: CLOSED MANIPULATION KNEE;  Surgeon: Donnice JONETTA Car, MD;  Location: [facility] ORS;  Service: Orthopedics;  Laterality: Right;   KNEE CLOSED REDUCTION  09/13/2012   Procedure: CLOSED MANIPULATION KNEE;  Surgeon: Donnice JONETTA Car, MD;  Location: [facility] ORS;  Service: Orthopedics;  Laterality: Right;   left knee arthroscopy      left rotator cuff surgery       Home Medications:  Allergies as of 03/14/2024       Reactions   Amoxicillin         Medication List        Accurate as of March 14, 2024 12:53 PM. If you have any questions, ask your nurse or doctor.          STOP taking these medications    albuterol  108 (90 Base) MCG/ACT inhaler Commonly known as: VENTOLIN  HFA Stopped by: Glendia JAYSON Barba   ondansetron  4 MG disintegrating tablet Commonly known as: ZOFRAN -ODT Stopped by: Glendia JAYSON Barba   predniSONE  50 MG tablet Commonly known as: DELTASONE  Stopped  by: Glendia JAYSON Barba   promethazine  12.5 MG tablet Commonly known as: PHENERGAN  Stopped by: Glendia JAYSON Barba   tamsulosin  0.4 MG Caps capsule Commonly known as: FLOMAX  Stopped by: Glendia JAYSON Barba       TAKE these medications    aspirin  EC 81 MG tablet Take 81 mg by mouth daily.   esomeprazole 40 MG capsule Commonly known as: NEXIUM Take 40 mg by mouth daily at 12 noon.   HYDROcodone -acetaminophen  5-325 MG tablet Commonly known as: Norco Take 1 tablet by mouth every 6 (six) hours as needed for moderate pain.   ibuprofen  800 MG tablet Commonly known as: ADVIL  Take 1 tablet (800 mg total) by mouth every 8 (eight) hours as needed for moderate pain.   lisinopril 10 MG tablet Commonly known as: ZESTRIL Take 10 mg by mouth daily.   loperamide  2 MG tablet Commonly known as: IMODIUM  A-D Take 1 tablet (2 mg total) by mouth 3 (three) times daily as needed for diarrhea or loose stools.   oxyCODONE  5 MG immediate release tablet Commonly known as: Oxy IR/ROXICODONE  Take 5 mg by mouth every 4 (four) hours as needed for severe pain.   oxyCODONE -acetaminophen  5-325 MG tablet Commonly known as: Roxicet Take 1 tablet by mouth every 4 (four) hours as needed for severe pain.        Allergies:  Allergies  Allergen Reactions  Amoxicillin     Family History: No family history on file.  Social History:  reports that he has been smoking cigarettes. He has a 22.5 pack-year smoking history. He has never used smokeless tobacco. He reports that he does not drink alcohol and does not use drugs.   Physical Exam: BP 136/64   Pulse 61   Ht 6' 1 (1.854 m)   Wt 242 lb (109.8 kg)   BMI 31.93 kg/m   Constitutional:  Alert and oriented, No acute distress. HEENT: [place] AT Respiratory: Normal respiratory effort, no increased work of breathing. GU: Prostate 35 g, smooth without nodules or induration Psychiatric: Normal mood and affect.    Assessment & Plan:    1.  Elevated  PSA Although PSA is a prostate cancer screening test he was informed that cancer is not the most common cause of an elevated PSA. Other potential causes including BPH and inflammation were discussed.  Urinalysis ordered to make sure no pyuria Repeat PSA if UA normal If PSA remains elevated >10 recommend prostate biopsy.  The procedure was cussed including potential risks of bleeding, infection/sepsis.   Glendia JAYSON Barba, MD  [facility] [address] [place], [place] [address] [phone]

## 2024-03-15 LAB — PSA: Prostate Specific Ag, Serum: 1.3 ng/mL (ref 0.0–4.0)

## 2024-03-20 ENCOUNTER — Ambulatory Visit: Payer: Self-pay | Admitting: Urology

## 2024-04-27 ENCOUNTER — Other Ambulatory Visit: Payer: Self-pay | Admitting: Internal Medicine

## 2024-04-27 DIAGNOSIS — R0602 Shortness of breath: Secondary | ICD-10-CM

## 2024-04-27 DIAGNOSIS — I5032 Chronic diastolic (congestive) heart failure: Secondary | ICD-10-CM

## 2024-05-18 ENCOUNTER — Telehealth (HOSPITAL_COMMUNITY): Payer: Self-pay | Admitting: Emergency Medicine

## 2024-05-18 NOTE — Telephone Encounter (Signed)
 Attempted to call patient regarding upcoming cardiac CT appointment. Left message on voicemail with name and callback number Rockwell Alexandria RN Navigator Cardiac Imaging [facility] [phone] Office [phone] Cell

## 2024-05-18 NOTE — Telephone Encounter (Signed)
 Reaching out to patient to offer assistance regarding upcoming cardiac imaging study; pt verbalizes understanding of appt date/time, parking situation and where to check in, pre-test NPO status and medications ordered, and verified current allergies; name and call back number provided for further questions should they arise Rockwell Alexandria RN Navigator Cardiac Imaging [facility] [phone] office [phone] cell

## 2024-05-19 ENCOUNTER — Ambulatory Visit
Admission: RE | Admit: 2024-05-19 | Discharge: 2024-05-19 | Disposition: A | Discharge status: Home / Self Care | Source: Ambulatory Visit | Attending: Internal Medicine | Admitting: Internal Medicine

## 2024-05-19 DIAGNOSIS — R0602 Shortness of breath: Secondary | ICD-10-CM | POA: Diagnosis present

## 2024-05-19 DIAGNOSIS — I5032 Chronic diastolic (congestive) heart failure: Secondary | ICD-10-CM | POA: Diagnosis present

## 2024-05-19 MED ORDER — IOHEXOL 350 MG/ML SOLN
100.0000 mL | Freq: Once | INTRAVENOUS | Status: AC | PRN
  Administered 2024-05-19: 100 mL via INTRAVENOUS

## 2024-05-19 MED ORDER — NITROGLYCERIN 0.4 MG SL SUBL
0.8000 mg | SUBLINGUAL_TABLET | Freq: Once | SUBLINGUAL | Status: AC
  Administered 2024-05-19: 0.8 mg via SUBLINGUAL

## 2024-05-19 NOTE — Progress Notes (Signed)
 Patient tolerated CT well. Vital signs stable encourage to drink water throughout day.Reasons explained and verbalized understanding. Ambulated steady gait.

## 2024-06-28 NOTE — Progress Notes (Signed)
 Established Patient Visit   Chief Complaint: Chief Complaint  Patient presents with   Follow-up    3 month follow up.     Date of Service: 07/11/2024 Date of Birth: Aug 03, 1951 PCP: Donal Channing SQUIBB, NP (Inactive)  History of Present Illness: Mathew Adams is a 73 y.o.male patient who presents for a 3 month follow up. PMH significant for PVCs, CHF, hypertension, hyperlipidemia, COPD, tobacco use, OSA, obesity.  Today, pt presents with no new cardiac concerns. Patient states that he is doing well. Denies having any recent chest pain. Occasional SOB under exertion. Has occasional swelling in feet. Encouraged to regularly exercise. He no longer smokes. No changes made today.   Visit Summaries: 04/25/2024 Patient was seen by me for a follow up. Order Echocardiogram for further assessment of SOB. Refer to pulmonary. Order CTA with FFR for further assessment.  Past Medical and Surgical History  Past Medical History Past Medical History:  Diagnosis Date   Arthropathy    COPD (chronic obstructive pulmonary disease) (CMS/HHS-HCC)    Elevated BP    Hyperlipidemia     Past Surgical History He has a past surgical history that includes Replacement total knee (Right, 2013); Colonoscopy (12/31/1998); shoulder surgery; Colonoscopy (02/27/2015); Colonoscopy (06/28/2015); and egd (11/07/1999).   Medications and Allergies  Current Medications  Current Outpatient Medications  Medication Sig Dispense Refill   aspirin  81 MG EC tablet Take by mouth     esomeprazole (NEXIUM) 20 MG DR capsule Take 20 mg by mouth once daily     folic acid (FOLVITE) 1 MG tablet Take 1 mg by mouth once daily Take 1 tablet by mouth every day     FUROsemide (LASIX) 40 MG tablet Take 40 mg by mouth once daily as needed for Swelling     lisinopriL-hydroCHLOROthiazide (ZESTORETIC) 20-25 mg tablet Take 1 tablet by mouth once daily     rosuvastatin (CRESTOR) 20 MG tablet Take 1 tablet (20 mg total) by mouth once daily  90 tablet 0   lisinopriL (ZESTRIL) 20 MG tablet Take 20 mg by mouth once daily (Patient not taking: Reported on 07/11/2024)     metoprolol TARTrate (LOPRESSOR) 25 MG tablet Take 1 dose (25 mg total) by mouth once along with your morning dose of beta-blocker on the day of your heart CT exam. (Patient not taking: Reported on 07/11/2024) 1 tablet 0   No current facility-administered medications for this visit.    Allergies: Amoxicillin  Social and Family History  Social History  reports that he has quit smoking. He has never used smokeless tobacco. He reports that he does not drink alcohol and does not use drugs.  Family History Family History  Problem Relation Name Age of Onset   Myocardial Infarction (Heart attack) Mother     Cirrhosis Father      Review of Systems   Pertinent positives and negatives are mentioned above in HPI and all other systems are negative.  Physical Examination   Vitals:BP 124/60   Pulse 76   Ht 185.4 cm (6' 1)   Wt (!) 110.7 kg (244 lb)   SpO2 98%   BMI 32.19 kg/m  Ht:185.4 cm (6' 1) Wt:(!) 110.7 kg (244 lb) ADJ:Anib surface area is 2.39 meters squared. Body mass index is 32.19 kg/m.  HEENT: Pupils equally reactive to light and accomodation  Neck: Supple without thyromegaly, carotid pulses 2+ Lungs: clear to auscultation bilaterally; no wheezes, rales, rhonchi Heart: Regular rate and rhythm.  No gallops, murmurs or rub Abdomen: soft  nontender, nondistended, with normal bowel sounds Extremities: no cyanosis, clubbing, or edema Peripheral Pulses: 2+ in all extremities, 2+ femoral pulses bilaterally Neurologic: Alert and oriented X3; speech intact; face symmetrical; moves all extremities well  Cardiovascular Studies:    Echocardiogram 2D complete: 05/06/2024 CONCLUSION ------------------------------------------------------------------------------- NORMAL LEFT VENTRICULAR SYSTOLIC FUNCTION WITH NO LVH ESTIMATED EF: >55% NORMAL LA PRESSURES  WITH DIASTOLIC DYSFUNCTION (GRADE 1) NORMAL RIGHT VENTRICULAR SYSTOLIC FUNCTION VALVULAR REGURGITATION: No AR, MILD MR, TRIVIAL PR, MILD TR ESTIMATED RVSP: 29 mmHg (Normal) NO VALVULAR STENOSIS  NM Myocardial Perfusion SPECT multiple (stress and rest): 03/01/2020 FINDINGS: Regional wall motion:  reveals normal myocardial thickening and wall motion. The overall quality of the study is good.   Artifacts noted: no Left ventricular cavity: normal.   Perfusion Analysis:  SPECT images demonstrate small perfusion abnormality of mild intensity is present in the inferior region on the stress images. Defect type : Mixed     IMPRESSION: Mildly abnormal myocardial perfusion scan evidence of inferior defect with relatively persistent with minimal border zone redistribution this area could represent ischemia versus artifact overall ejection fraction is 56%.  Recommend medical therapy unless symptoms worsen then consideration for invasive strategy can be made  Cardiac Catheterization:   Holter:  Cardiac CT Scan: 05/19/2024 IMPRESSION:   1. Coronary calcium score of 391. This was 67th percentile for age  and sex matched control.   2. Normal coronary origin with left dominance.   3. Mild proximal LAD stenosis (25-49%). Minimal (<25%) Left main  stenosis.   4. CAD-RADS 2. Mild non-obstructive CAD (25-49%). Consider  non-atherosclerotic causes of chest pain. Consider preventive  therapy and risk factor modification.   5. See separate report from [facility] radiology for non-cardiac  findings.   Cardiac MRI:   Assessment   73 y.o. male with  1. Primary hypertension   2. Mixed hyperlipidemia   3. Chronic diastolic CHF (congestive heart failure) (CMS/HHS-HCC)   4. OSA (obstructive sleep apnea)   5. Frequent PVCs   6. Chronic obstructive pulmonary disease, unspecified COPD type (CMS/HHS-HCC)   7. Obesity, Class I, BMI 30-34.9   8. Edema of foot   9. Tobacco use    Plan    Hypertension, today's BP was 124/60, reasonably controlled, continue lisinopril-HCTZ, metoprolol  Hyperlipidemia, consider statin therapy if not at lipid goal OSA, recommend sleep study, CPAP if indicated, exercise  COPD, follow up with pulmonary as needed  Obesity, recommend weight loss, exercise, and portion control Edema, continue lasix, recommend support stockings, BLE elevation, and reducing sodium intake Smoking history, continue tobacco cessation     Return in about 1 year (around 07/11/2025).  This note is partially written by Leita Ellen, in the presence of and acting as the scribe of Dr. Cara Adams.      Leita Ellen  I have reviewed, edited and added to the note to reflect my best personal medical judgment.  Attestation Statement:   I personally performed the service. (TP)  Mathew JONETTA LOVELACE, MD  [facility] [place], [place] Ph:  [phone] Fax:  [phone] This note was generated in part with voice recognition software, Dragon.  I apologize for any typographical errors that were not detected and corrected from this process.  They are unintentional.

## 2024-08-09 ENCOUNTER — Encounter: Payer: Self-pay | Admitting: Internal Medicine

## 2024-08-16 ENCOUNTER — Inpatient Hospital Stay

## 2024-08-16 ENCOUNTER — Inpatient Hospital Stay: Attending: Internal Medicine | Admitting: Internal Medicine

## 2024-08-16 ENCOUNTER — Encounter: Payer: Self-pay | Admitting: Internal Medicine

## 2024-08-16 VITALS — BP 129/56 | HR 63 | Temp 97.3°F | Ht 73.0 in | Wt 245.0 lb

## 2024-08-16 DIAGNOSIS — D649 Anemia, unspecified: Secondary | ICD-10-CM | POA: Diagnosis not present

## 2024-08-16 DIAGNOSIS — Z572 Occupational exposure to dust: Secondary | ICD-10-CM | POA: Diagnosis not present

## 2024-08-16 DIAGNOSIS — D539 Nutritional anemia, unspecified: Secondary | ICD-10-CM | POA: Diagnosis present

## 2024-08-16 DIAGNOSIS — D696 Thrombocytopenia, unspecified: Secondary | ICD-10-CM | POA: Diagnosis not present

## 2024-08-16 DIAGNOSIS — Z79899 Other long term (current) drug therapy: Secondary | ICD-10-CM | POA: Insufficient documentation

## 2024-08-16 DIAGNOSIS — R7989 Other specified abnormal findings of blood chemistry: Secondary | ICD-10-CM

## 2024-08-16 DIAGNOSIS — Z87891 Personal history of nicotine dependence: Secondary | ICD-10-CM | POA: Diagnosis not present

## 2024-08-16 DIAGNOSIS — Z79890 Hormone replacement therapy (postmenopausal): Secondary | ICD-10-CM | POA: Diagnosis not present

## 2024-08-16 DIAGNOSIS — R5383 Other fatigue: Secondary | ICD-10-CM | POA: Diagnosis not present

## 2024-08-16 DIAGNOSIS — E538 Deficiency of other specified B group vitamins: Secondary | ICD-10-CM | POA: Diagnosis not present

## 2024-08-16 DIAGNOSIS — Z8249 Family history of ischemic heart disease and other diseases of the circulatory system: Secondary | ICD-10-CM | POA: Diagnosis not present

## 2024-08-16 DIAGNOSIS — Z801 Family history of malignant neoplasm of trachea, bronchus and lung: Secondary | ICD-10-CM | POA: Diagnosis not present

## 2024-08-16 DIAGNOSIS — Z59868 Other specified financial insecurity: Secondary | ICD-10-CM | POA: Insufficient documentation

## 2024-08-16 DIAGNOSIS — Z880 Allergy status to penicillin: Secondary | ICD-10-CM | POA: Insufficient documentation

## 2024-08-16 DIAGNOSIS — K769 Liver disease, unspecified: Secondary | ICD-10-CM | POA: Insufficient documentation

## 2024-08-16 DIAGNOSIS — M2550 Pain in unspecified joint: Secondary | ICD-10-CM | POA: Diagnosis not present

## 2024-08-16 DIAGNOSIS — Z7982 Long term (current) use of aspirin: Secondary | ICD-10-CM | POA: Diagnosis not present

## 2024-08-16 LAB — CBC WITH DIFFERENTIAL/PLATELET
Abs Immature Granulocytes: 0.16 K/uL — ABNORMAL HIGH (ref 0.00–0.07)
Eosinophils Absolute: 0.3 K/uL (ref 0.0–0.5)
Eosinophils Relative: 3 %
HCT: 32.6 % — ABNORMAL LOW (ref 39.0–52.0)
Hemoglobin: 10.5 g/dL — ABNORMAL LOW (ref 13.0–17.0)
Immature Granulocytes: 2 %
Lymphocytes Relative: 16 %
Lymphs Abs: 1.5 K/uL (ref 0.7–4.0)
MCH: 31.6 pg (ref 26.0–34.0)
MCHC: 32.2 g/dL (ref 30.0–36.0)
MCV: 98.2 fL (ref 80.0–100.0)
Monocytes Absolute: 2.5 K/uL — ABNORMAL HIGH (ref 0.1–1.0)
Monocytes Relative: 26 %
Neutro Abs: 5 K/uL (ref 1.7–7.7)
Neutrophils Relative %: 53 %
Platelets: 106 K/uL — ABNORMAL LOW (ref 150–400)
RBC: 3.32 MIL/uL — ABNORMAL LOW (ref 4.22–5.81)
RDW: 14.2 % (ref 11.5–15.5)
Smear Review: DECREASED
WBC: 9.4 K/uL (ref 4.0–10.5)
nRBC: 0 % (ref 0.0–0.2)

## 2024-08-16 LAB — COMPREHENSIVE METABOLIC PANEL WITH GFR
ALT: 26 U/L (ref 0–44)
AST: 28 U/L (ref 15–41)
Albumin: 4.2 g/dL (ref 3.5–5.0)
Alkaline Phosphatase: 72 U/L (ref 38–126)
Anion gap: 11 (ref 5–15)
BUN: 20 mg/dL (ref 8–23)
CO2: 24 mmol/L (ref 22–32)
Calcium: 9.5 mg/dL (ref 8.9–10.3)
Chloride: 105 mmol/L (ref 98–111)
Creatinine, Ser: 1.16 mg/dL (ref 0.61–1.24)
GFR, Estimated: >60 mL/min
Glucose, Bld: 88 mg/dL (ref 70–99)
Potassium: 3.9 mmol/L (ref 3.5–5.1)
Sodium: 141 mmol/L (ref 135–145)
Total Bilirubin: 0.5 mg/dL (ref 0.0–1.2)
Total Protein: 6.9 g/dL (ref 6.5–8.1)

## 2024-08-16 LAB — LACTATE DEHYDROGENASE: LDH: 124 U/L (ref 105–235)

## 2024-08-16 LAB — TECHNOLOGIST SMEAR REVIEW: Plt Morphology: DECREASED

## 2024-08-16 LAB — RETICULOCYTES
Immature Retic Fract: 14.6 % (ref 2.3–15.9)
RBC.: 3.38 MIL/uL — ABNORMAL LOW (ref 4.22–5.81)
Retic Count, Absolute: 53.1 K/uL (ref 19.0–186.0)
Retic Ct Pct: 1.6 % (ref 0.4–3.1)

## 2024-08-16 LAB — FOLATE: Folate: >20 ng/mL

## 2024-08-16 LAB — VITAMIN B12: Vitamin B-12: 344 pg/mL (ref 180–914)

## 2024-08-16 NOTE — Assessment & Plan Note (Addendum)
 Anemia: 10.7: Hemoglobin : MCV 103-normal white count/platelets-102.  B12 179 [PCP December 2025]-likely secondary to B12 deficiency.    However I also discussed multiple etiologies of macrocytosis-including nutritional deficiency like B12 folic acid; medications/including alcohol; and liver disease is.  Also primary bone marrow disorders like myelodysplastic syndromes etc. are also included in the differential.For now I recommend holding off any invasive procedures or bone marrow biopsy at this time.   # Recommend proceeding with B12 injections; and reevaluate labs in about 2 months.  If not improving consider other etiologies including but not limited to malignancy like MDS, which would need further evaluation with a bone marrow biopsy.  Today we will repeat labs including B12 chemistry CBC review of smear LDH and also methylmalonic acid and antiparietal/intrinsic factor antibodies.  Thank you Mathew Adams . for allowing me to participate in the care of your pleasant patient. Please do not hesitate to contact me with questions or concerns in the interim.  # DISPOSITION: # labs today # B12 injection weekly x4 # follow up in 2 months- MD; labs- cbc/bmp; B12; b12 injection- Dr.B

## 2024-08-16 NOTE — Progress Notes (Signed)
 Patient states no concerns just would like to discuss the reason he is here today.

## 2024-08-16 NOTE — Progress Notes (Signed)
 [facility] CONSULT NOTE  Patient Care Team: Donal Channing SQUIBB, FNP as PCP - General (Family Medicine) Rennie Cindy SAUNDERS, MD as Consulting Physician (Oncology)  CHIEF COMPLAINTS/PURPOSE OF CONSULTATION: ANEMIA   HEMATOLOGY HISTORY  # ANEMIA[Hb; MCV-platelets- WBC; Iron sat; ferritin;  GFR- CT/US ; EGD/colonoscopy-  HISTORY OF PRESENTING ILLNESS:  Mathew Adams 74 y.o.  male pleasant patient was been referred to us  for further evaluation of macrocytic anemia.  Discussed the use of AI scribe software for clinical note transcription with the patient, who gave verbal consent to proceed.  History of Present Illness   Mathew Adams is a 74 year old male who presents for evaluation of newly identified anemia, thrombocytopenia, and vitamin B12 deficiency.  He was referred after routine laboratory studies revealed mild anemia (hemoglobin 10.7 g/dL), thrombocytopenia (platelets 102 x10^9/L), and vitamin B12 deficiency (179 pg/mL). He was previously unaware of any hematologic abnormalities and has not seen a hematologist before. He has never received chemotherapy, blood transfusions, iron infusions, or undergone bone marrow biopsy. There is no personal history of malignancy.  He denies melena, hematuria, dysphagia, constipation, diarrhea, or hepatic dysfunction. Appetite is preserved and there has been no unintentional weight loss. He does not consume alcohol. He reports mild distal paresthesias in the hands and feet. He denies epistaxis, gingival bleeding, or other abnormal bleeding. He endorses decreased energy.  Current medications include folic acid (indication unclear), an acid-suppressing agent, and testosterone. There is no history of bariatric surgery or use of medications for rheumatoid arthritis. 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       Chemotherapy drugs including  methotrexate:  Alcohol: none Blood in stools:none Blood in urine:none Difficulty swallowing:none Change of bowel movement/constipation:none Prior blood transfusion:none Prior history of blood loss: none Liver disease:none  Bariatric surgery: none  Prior evaluation with hematology:none Prior bone marrow biopsy: none Prior IV iron infusions: none    Review of Systems  Constitutional:  Positive for malaise/fatigue. Negative for chills, diaphoresis, fever and weight loss.  HENT:  Negative for nosebleeds and sore throat.   Eyes:  Negative for double vision.  Respiratory:  Negative for cough, hemoptysis, sputum production, shortness of breath and wheezing.   Cardiovascular:  Negative for chest pain, palpitations, orthopnea and leg swelling.  Gastrointestinal:  Negative for abdominal pain, blood in stool, constipation, diarrhea, heartburn, melena, nausea and vomiting.  Genitourinary:  Negative for dysuria, frequency and urgency.  Musculoskeletal:  Positive for joint pain. Negative for back pain.  Skin: Negative.  Negative for itching and rash.  Neurological:  Negative for dizziness, tingling, focal weakness, weakness and headaches.  Endo/Heme/Allergies:  Does not bruise/bleed easily.  Psychiatric/Behavioral:  Negative for depression. The patient is not nervous/anxious and does not have insomnia.      MEDICAL HISTORY:  Past Medical History:  Diagnosis Date   Arthritis    GERD (gastroesophageal reflux disease)    Hypertension    PONV (postoperative nausea and vomiting)     SURGICAL HISTORY: Past Surgical History:  Procedure Laterality Date   COLONOSCOPY WITH PROPOFOL  N/A 06/28/2015   Procedure: COLONOSCOPY WITH PROPOFOL ;  Surgeon: Mathew ONEIDA Holmes, MD;  Location: [facility] ENDOSCOPY;  Service: Endoscopy;  Laterality: N/A;   JOINT REPLACEMENT     right knee replacement   KNEE CLOSED REDUCTION  06/24/2012   Procedure: CLOSED MANIPULATION KNEE;  Surgeon: Donnice JONETTA Car, MD;   Location: [facility] ORS;  Service: Orthopedics;  Laterality: Right;  KNEE CLOSED REDUCTION  09/13/2012   Procedure: CLOSED MANIPULATION KNEE;  Surgeon: Donnice JONETTA Car, MD;  Location: [facility] ORS;  Service: Orthopedics;  Laterality: Right;   left knee arthroscopy      left rotator cuff surgery       SOCIAL HISTORY: Social History   Socioeconomic History   Marital status: Single    Spouse name: Not on file   Number of children: Not on file   Years of education: Not on file   Highest education level: Not on file  Occupational History   Not on file  Tobacco Use   Smoking status: Former    Average packs/day: 0.5 packs/day for 45.0 years (22.5 ttl pk-yrs)    Types: Cigarettes    Start date: 2026    Quit date: 40    Years since quitting: 59.0   Smokeless tobacco: Never  Vaping Use   Vaping status: Never Used  Substance and Sexual Activity   Alcohol use: No   Drug use: No   Sexual activity: Not on file  Other Topics Concern   Not on file  Social History Narrative   Not on file   Social Drivers of Health   Tobacco Use: Medium Risk (08/16/2024)   Patient History    Smoking Tobacco Use: Former    Smokeless Tobacco Use: Never    Passive Exposure: Not on file  Financial Resource Strain: Medium Risk (06/06/2024)   Received from [facility]   Overall Financial Resource Strain (CARDIA)    Difficulty of Paying Living Expenses: Somewhat hard  Food Insecurity: No Food Insecurity (08/16/2024)   Epic    Worried About Radiation Protection Practitioner of Food in the Last Year: Never true    Ran Out of Food in the Last Year: Never true  Transportation Needs: No Transportation Needs (08/16/2024)   Epic    Lack of Transportation (Medical): No    Lack of Transportation (Non-Medical): No  Physical Activity: Not on file  Stress: Not on file  Social Connections: Not on file  Intimate Partner Violence: Not At Risk (08/16/2024)   Epic    Fear of Current or Ex-Partner: No    Emotionally Abused: No     Physically Abused: No    Sexually Abused: No  Depression (PHQ2-9): Low Risk (08/16/2024)   Depression (PHQ2-9)    PHQ-2 Score: 0  Alcohol Screen: Not on file  Housing: Low Risk (08/16/2024)   Epic    Unable to Pay for Housing in the Last Year: No    Number of Times Moved in the Last Year: 0    Homeless in the Last Year: No  Utilities: Not At Risk (08/16/2024)   Epic    Threatened with loss of utilities: No  Health Literacy: Not on file    FAMILY HISTORY: Family History  Problem Relation Age of Onset   Lung cancer Sister    Heart attack Sister    Heart attack Brother     ALLERGIES:  is allergic to amoxicillin.  MEDICATIONS:  Current Outpatient Medications  Medication Sig Dispense Refill   aspirin  EC 81 MG tablet Take 81 mg by mouth daily.     esomeprazole (NEXIUM) 40 MG capsule Take 40 mg by mouth daily at 12 noon.     folic acid (FOLVITE) 1 MG tablet Take 1 mg by mouth daily.     HYDROcodone -acetaminophen  (NORCO) 5-325 MG tablet Take 1 tablet by mouth every 6 (six) hours as needed for moderate pain. 15  tablet 0   ibuprofen  (ADVIL ) 800 MG tablet Take 1 tablet (800 mg total) by mouth every 8 (eight) hours as needed for moderate pain. 15 tablet 0   lisinopril (PRINIVIL,ZESTRIL) 10 MG tablet Take 10 mg by mouth daily.     loperamide  (IMODIUM  A-D) 2 MG tablet Take 1 tablet (2 mg total) by mouth 3 (three) times daily as needed for diarrhea or loose stools. 30 tablet 0   oxyCODONE  (OXY IR/ROXICODONE ) 5 MG immediate release tablet Take 5 mg by mouth every 4 (four) hours as needed for severe pain.     oxyCODONE -acetaminophen  (ROXICET) 5-325 MG tablet Take 1 tablet by mouth every 4 (four) hours as needed for severe pain. 20 tablet 0   pravastatin (PRAVACHOL) 20 MG tablet Take 20 mg by mouth daily.     No current facility-administered medications for this visit.     SABRA  PHYSICAL EXAMINATION:   Vitals:   08/16/24 1058  BP: (!) 129/56  Pulse: 63  Temp: (!) 97.3 F (36.3 C)  SpO2:  100%   Filed Weights   08/16/24 1058  Weight: 245 lb (111.1 kg)    Physical Exam Vitals and nursing note reviewed.  HENT:     Head: Normocephalic and atraumatic.     Mouth/Throat:     Pharynx: Oropharynx is clear.  Eyes:     Extraocular Movements: Extraocular movements intact.     Pupils: Pupils are equal, round, and reactive to light.  Cardiovascular:     Rate and Rhythm: Normal rate and regular rhythm.  Pulmonary:     Comments: Decreased breath sounds bilaterally.  Abdominal:     Palpations: Abdomen is soft.  Musculoskeletal:        General: Normal range of motion.     Cervical back: Normal range of motion.  Skin:    General: Skin is warm.  Neurological:     General: No focal deficit present.     Mental Status: He is alert and oriented to person, place, and time.  Psychiatric:        Behavior: Behavior normal.        Judgment: Judgment normal.      LABORATORY DATA:  I have reviewed the data as listed Lab Results  Component Value Date   WBC 9.8 07/18/2022   HGB 14.3 07/18/2022   HCT 42.7 07/18/2022   MCV 91.2 07/18/2022   PLT 111 (L) 07/18/2022   Recent Labs    08/16/24 1207  NA 141  K 3.9  CL 105  CO2 24  GLUCOSE 88  BUN 20  CREATININE 1.16  CALCIUM 9.5  GFRNONAA >60  PROT 6.9  ALBUMIN 4.2  AST 28  ALT 26  ALKPHOS 72  BILITOT 0.5     No results found.  ASSESSMENT & PLAN:   Abnormal CBC Anemia: 10.7: Hemoglobin : MCV 103-normal white count/platelets-102.  B12 179 [PCP December 2025]-likely secondary to B12 deficiency.    However I also discussed multiple etiologies of macrocytosis-including nutritional deficiency like B12 folic acid; medications/including alcohol; and liver disease is.  Also primary bone marrow disorders like myelodysplastic syndromes etc. are also included in the differential.For now I recommend holding off any invasive procedures or bone marrow biopsy at this time.   # Recommend proceeding with B12 injections; and  reevaluate labs in about 2 months.  If not improving consider other etiologies including but not limited to malignancy like MDS, which would need further evaluation with a bone marrow biopsy.  Today  we will repeat labs including B12 chemistry CBC review of smear LDH and also methylmalonic acid and antiparietal/intrinsic factor antibodies.  Thank you Ms.Donal . for allowing me to participate in the care of your pleasant patient. Please do not hesitate to contact me with questions or concerns in the interim.  # DISPOSITION: # labs today # B12 injection weekly x4 # follow up in 2 months- MD; labs- cbc/bmp; B12; b12 injection- Dr.B  # 45 minutes face-to-face with the patient discussing the above plan of care; more than 50% of time spent on prognosis/ natural history; counseling and coordination. All questions were answered. The patient knows to call the clinic with any problems, questions or concerns.    Cindy JONELLE Joe, MD 08/16/2024 1:06 PM

## 2024-08-17 ENCOUNTER — Ambulatory Visit: Payer: Self-pay | Admitting: Internal Medicine

## 2024-08-17 LAB — METHYLMALONIC ACID, SERUM: Methylmalonic Acid, Quantitative: 509 nmol/L — ABNORMAL HIGH (ref 0–378)

## 2024-08-17 LAB — INTRINSIC FACTOR ANTIBODIES: Intrinsic Factor: 1.3 [AU]/ml — ABNORMAL HIGH (ref 0.0–1.1)

## 2024-08-17 LAB — ANTI-PARIETAL ANTIBODY: Parietal Cell Antibody-IgG: 44.9 U — ABNORMAL HIGH (ref 0.0–20.0)

## 2024-08-23 ENCOUNTER — Inpatient Hospital Stay

## 2024-08-23 ENCOUNTER — Telehealth: Payer: Self-pay | Admitting: Internal Medicine

## 2024-08-23 DIAGNOSIS — D539 Nutritional anemia, unspecified: Secondary | ICD-10-CM | POA: Diagnosis not present

## 2024-08-23 DIAGNOSIS — E538 Deficiency of other specified B group vitamins: Secondary | ICD-10-CM

## 2024-08-23 MED ORDER — CYANOCOBALAMIN 1000 MCG/ML IJ SOLN
1000.0000 ug | Freq: Once | INTRAMUSCULAR | Status: AC
  Administered 2024-08-23: 1000 ug via INTRAMUSCULAR

## 2024-08-23 NOTE — Telephone Encounter (Signed)
 Pt called to confirm appt for today - LH

## 2024-08-30 ENCOUNTER — Inpatient Hospital Stay

## 2024-08-30 DIAGNOSIS — E538 Deficiency of other specified B group vitamins: Secondary | ICD-10-CM

## 2024-08-30 DIAGNOSIS — D539 Nutritional anemia, unspecified: Secondary | ICD-10-CM | POA: Diagnosis not present

## 2024-08-30 MED ORDER — CYANOCOBALAMIN 1000 MCG/ML IJ SOLN
1000.0000 ug | Freq: Once | INTRAMUSCULAR | Status: AC
  Administered 2024-08-30: 1000 ug via INTRAMUSCULAR
  Filled 2024-08-30: qty 1

## 2024-08-31 ENCOUNTER — Emergency Department

## 2024-08-31 ENCOUNTER — Other Ambulatory Visit: Payer: Self-pay

## 2024-08-31 ENCOUNTER — Emergency Department
Admission: EM | Admit: 2024-08-31 | Discharge: 2024-08-31 | Disposition: A | Discharge status: Home / Self Care | Attending: Emergency Medicine | Admitting: Emergency Medicine

## 2024-08-31 DIAGNOSIS — I10 Essential (primary) hypertension: Secondary | ICD-10-CM | POA: Diagnosis not present

## 2024-08-31 DIAGNOSIS — R519 Headache, unspecified: Secondary | ICD-10-CM | POA: Diagnosis present

## 2024-08-31 DIAGNOSIS — U071 COVID-19: Secondary | ICD-10-CM | POA: Diagnosis not present

## 2024-08-31 DIAGNOSIS — R112 Nausea with vomiting, unspecified: Secondary | ICD-10-CM

## 2024-08-31 LAB — CBC WITH DIFFERENTIAL/PLATELET
Abs Immature Granulocytes: 0.17 K/uL — ABNORMAL HIGH (ref 0.00–0.07)
Basophils Absolute: 0 K/uL (ref 0.0–0.1)
Basophils Relative: 0 %
Eosinophils Absolute: 0.1 K/uL (ref 0.0–0.5)
Eosinophils Relative: 1 %
HCT: 34.6 % — ABNORMAL LOW (ref 39.0–52.0)
Hemoglobin: 11.3 g/dL — ABNORMAL LOW (ref 13.0–17.0)
Immature Granulocytes: 2 %
Lymphocytes Relative: 3 %
Lymphs Abs: 0.3 K/uL — ABNORMAL LOW (ref 0.7–4.0)
MCH: 31.7 pg (ref 26.0–34.0)
MCHC: 32.7 g/dL (ref 30.0–36.0)
MCV: 96.9 fL (ref 80.0–100.0)
Monocytes Absolute: 2 K/uL — ABNORMAL HIGH (ref 0.1–1.0)
Monocytes Relative: 22 %
Neutro Abs: 6.4 K/uL (ref 1.7–7.7)
Neutrophils Relative %: 72 %
Platelets: 80 K/uL — ABNORMAL LOW (ref 150–400)
RBC: 3.57 MIL/uL — ABNORMAL LOW (ref 4.22–5.81)
RDW: 14.3 % (ref 11.5–15.5)
WBC: 8.9 K/uL (ref 4.0–10.5)
nRBC: 0 % (ref 0.0–0.2)

## 2024-08-31 LAB — COMPREHENSIVE METABOLIC PANEL WITH GFR
ALT: 51 U/L — ABNORMAL HIGH (ref 0–44)
AST: 66 U/L — ABNORMAL HIGH (ref 15–41)
Albumin: 4.3 g/dL (ref 3.5–5.0)
Alkaline Phosphatase: 81 U/L (ref 38–126)
Anion gap: 10 (ref 5–15)
BUN: 24 mg/dL — ABNORMAL HIGH (ref 8–23)
CO2: 25 mmol/L (ref 22–32)
Calcium: 9.7 mg/dL (ref 8.9–10.3)
Chloride: 103 mmol/L (ref 98–111)
Creatinine, Ser: 1.35 mg/dL — ABNORMAL HIGH (ref 0.61–1.24)
GFR, Estimated: 56 mL/min — ABNORMAL LOW
Glucose, Bld: 118 mg/dL — ABNORMAL HIGH (ref 70–99)
Potassium: 4.2 mmol/L (ref 3.5–5.1)
Sodium: 138 mmol/L (ref 135–145)
Total Bilirubin: 0.7 mg/dL (ref 0.0–1.2)
Total Protein: 7.1 g/dL (ref 6.5–8.1)

## 2024-08-31 LAB — URINALYSIS, ROUTINE W REFLEX MICROSCOPIC
Bacteria, UA: NONE SEEN
Bilirubin Urine: NEGATIVE
Glucose, UA: NEGATIVE mg/dL
Hgb urine dipstick: NEGATIVE
Ketones, ur: NEGATIVE mg/dL
Leukocytes,Ua: NEGATIVE
Nitrite: NEGATIVE
Protein, ur: 30 mg/dL — AB
RBC / HPF: 0 RBC/hpf (ref 0–5)
Specific Gravity, Urine: 1.015 (ref 1.005–1.030)
pH: 6 (ref 5.0–8.0)

## 2024-08-31 LAB — TROPONIN T, HIGH SENSITIVITY
Troponin T High Sensitivity: 15 ng/L (ref 0–19)
Troponin T High Sensitivity: 19 ng/L (ref 0–19)

## 2024-08-31 LAB — RESP PANEL BY RT-PCR (RSV, FLU A&B, COVID)  RVPGX2
Influenza A by PCR: NEGATIVE
Influenza B by PCR: NEGATIVE
Resp Syncytial Virus by PCR: NEGATIVE
SARS Coronavirus 2 by RT PCR: POSITIVE — AB

## 2024-08-31 LAB — LACTIC ACID, PLASMA: Lactic Acid, Venous: 1.9 mmol/L (ref 0.5–1.9)

## 2024-08-31 LAB — LIPASE, BLOOD: Lipase: 20 U/L (ref 11–51)

## 2024-08-31 MED ORDER — LACTATED RINGERS IV BOLUS
1000.0000 mL | Freq: Once | INTRAVENOUS | Status: AC
  Administered 2024-08-31: 1000 mL via INTRAVENOUS

## 2024-08-31 MED ORDER — ACETAMINOPHEN 500 MG PO TABS
1000.0000 mg | ORAL_TABLET | Freq: Once | ORAL | Status: AC
  Administered 2024-08-31: 1000 mg via ORAL
  Filled 2024-08-31: qty 2

## 2024-08-31 MED ORDER — DIPHENHYDRAMINE HCL 50 MG/ML IJ SOLN
25.0000 mg | Freq: Once | INTRAMUSCULAR | Status: AC
  Administered 2024-08-31: 25 mg via INTRAVENOUS
  Filled 2024-08-31: qty 1

## 2024-08-31 MED ORDER — ONDANSETRON 4 MG PO TBDP: 4.0000 mg | ORAL_TABLET | Freq: Three times a day (TID) | ORAL | 0 refills | Status: AC | PRN

## 2024-08-31 MED ORDER — PAXLOVID (150/100) 10 X 150 MG & 10 X 100MG PO TBPK: 2.0000 | ORAL_TABLET | Freq: Two times a day (BID) | ORAL | 0 refills | Status: AC

## 2024-08-31 MED ORDER — PROCHLORPERAZINE EDISYLATE 10 MG/2ML IJ SOLN
5.0000 mg | Freq: Once | INTRAMUSCULAR | Status: AC
  Administered 2024-08-31: 5 mg via INTRAVENOUS
  Filled 2024-08-31: qty 2

## 2024-08-31 NOTE — ED Provider Notes (Signed)
 "  [facility] Provider Note    Event Date/Time   First MD Initiated Contact with Patient 08/31/24 1007     (approximate)   History   Chief Complaint Dizziness and Emesis   HPI  Mathew Adams is a 74 y.o. male with past medical history of hypertension and GERD who presents to the ED complaining of headache and vomiting.  Patient reports that since 3:00 early this morning, he has been dealing with nausea, vomiting, and persistent headache.  He reports feeling feverish with malaise, but had not checked his temperature at home.  He also reports soreness extending across his chest and abdomen.  He has not noticed any blood in his emesis or stool and denies any dysuria or flank pain.  He is not aware of any sick contacts.     Physical Exam   Triage Vital Signs: ED Triage Vitals [08/31/24 1011]  Encounter Vitals Group     BP (!) 163/57     Girls Systolic BP Percentile      Girls Diastolic BP Percentile      Boys Systolic BP Percentile      Boys Diastolic BP Percentile      Pulse Rate (!) 123     Resp (!) 26     Temp (!) 100.8 F (38.2 C)     Temp Source Oral     SpO2 95 %     Weight 251 lb (113.9 kg)     Height 6' 1 (1.854 m)     Head Circumference      Peak Flow      Pain Score 10     Pain Loc      Pain Education      Exclude from Growth Chart     Most recent vital signs: Vitals:   08/31/24 1243 08/31/24 1245  BP: (!) 125/47   Pulse: (!) 104   Resp: 20   Temp:    SpO2: 97% 97%    Constitutional: Alert and oriented. Eyes: Conjunctivae are normal.  Pupils equal, round, and reactive to light bilaterally. Head: Atraumatic. Nose: No congestion/rhinnorhea. Mouth/Throat: Mucous membranes are moist.  Neck: Supple with no meningismus. Cardiovascular: Tachycardic, regular rhythm. Grossly normal heart sounds.  2+ radial pulses bilaterally. Respiratory: Normal respiratory effort.  No retractions. Lungs CTAB. Gastrointestinal: Soft and  nontender. No distention. Musculoskeletal: No lower extremity tenderness nor edema.  Neurologic:  Normal speech and language. No gross focal neurologic deficits are appreciated.    ED Results / Procedures / Treatments   Labs (all labs ordered are listed, but only abnormal results are displayed) Labs Reviewed  RESP PANEL BY RT-PCR (RSV, FLU A&B, COVID)  RVPGX2 - Abnormal; Notable for the following components:      Result Value   SARS Coronavirus 2 by RT PCR POSITIVE (*)    All other components within normal limits  COMPREHENSIVE METABOLIC PANEL WITH GFR - Abnormal; Notable for the following components:   Glucose, Bld 118 (*)    BUN 24 (*)    Creatinine, Ser 1.35 (*)    AST 66 (*)    ALT 51 (*)    GFR, Estimated 56 (*)    All other components within normal limits  URINALYSIS, ROUTINE W REFLEX MICROSCOPIC - Abnormal; Notable for the following components:   Color, Urine YELLOW (*)    APPearance CLEAR (*)    Protein, ur 30 (*)    All other components within normal limits  CBC WITH  DIFFERENTIAL/PLATELET - Abnormal; Notable for the following components:   RBC 3.57 (*)    Hemoglobin 11.3 (*)    HCT 34.6 (*)    Platelets 80 (*)    Lymphs Abs 0.3 (*)    Monocytes Absolute 2.0 (*)    Abs Immature Granulocytes 0.17 (*)    All other components within normal limits  CULTURE, BLOOD (ROUTINE X 2)  CULTURE, BLOOD (ROUTINE X 2)  LIPASE, BLOOD  LACTIC ACID, PLASMA  CBC WITH DIFFERENTIAL/PLATELET  TROPONIN T, HIGH SENSITIVITY  TROPONIN T, HIGH SENSITIVITY     EKG  ED ECG REPORT I, Carlin Palin, the attending physician, personally viewed and interpreted this ECG.   Date: 08/31/2024  EKG Time: 10:16  Rate: 120  Rhythm: sinus tachycardia  Axis: Normal  Intervals:none  ST&T Change: None  RADIOLOGY CT head reviewed and interpreted by me with no hemorrhage or midline shift.  PROCEDURES:  Critical Care performed: No  Procedures   MEDICATIONS ORDERED IN ED: Medications   lactated ringers  bolus 1,000 mL (0 mLs Intravenous Stopped 08/31/24 1236)  acetaminophen  (TYLENOL ) tablet 1,000 mg (1,000 mg Oral Given 08/31/24 1038)  prochlorperazine  (COMPAZINE ) injection 5 mg (5 mg Intravenous Given 08/31/24 1037)  diphenhydrAMINE  (BENADRYL ) injection 25 mg (25 mg Intravenous Given 08/31/24 1037)     IMPRESSION / MDM / ASSESSMENT AND PLAN / ED COURSE  I reviewed the triage vital signs and the nursing notes.                              74 y.o. male with past medical history of hypertension and GERD who presents to the ED complaining of persistent nausea and vomiting with headache since 3:00 this morning.  Patient's presentation is most consistent with acute presentation with potential threat to life or bodily function.  Differential diagnosis includes, but is not limited to, sepsis, meningitis, viral illness, pneumonia, UTI, gastroenteritis, dehydration, electrolyte abnormality, AKI.  Patient uncomfortable but nontoxic-appearing and in no acute distress, vital signs remarkable for fever, tachycardia, and tachypnea.  Sepsis workup was initiated but viral illness seems most likely and we will hold off on IV antibiotics for now.  COVID and flu testing is pending, will also check CT head given his significant headache.  Labs and urinalysis as well as chest x-ray are pending, will treat symptomatically with IV Compazine  and Benadryl , treat fever with Tylenol .  CT head negative for acute process, chest x-ray also unremarkable.  Patient's viral panel came back positive for COVID-19, which is likely the source of his fever.  Labs show mild AKI without significant anemia, leukocytosis, or electrolyte abnormality.  Very mild transaminitis noted but he continues to have a benign abdominal exam and suspect this is due to viral illness.  He is feeling much better following Compazine  and Benadryl , tolerating oral intake without difficulty with resolution of headache.  He denies significant  difficulty breathing at this time, was able to ambulate without hypoxia or difficulty breathing.  Admission considered given generalized weakness and COVID-19, however family agreeable to discharge home with course of nausea medication and Paxlovid .  He is no longer taking pain medication that might interact with Paxlovid .  He was counseled to return to the ED for new or worsening symptoms, otherwise follow-up with PCP.  Patient and family agree with plan.      FINAL CLINICAL IMPRESSION(S) / ED DIAGNOSES   Final diagnoses:  COVID-19  Nausea and vomiting, unspecified vomiting type  Acute nonintractable headache, unspecified headache type     Rx / DC Orders   ED Discharge Orders          Ordered    ondansetron  (ZOFRAN -ODT) 4 MG disintegrating tablet  Every 8 hours PRN        08/31/24 1342    nirmatrelvir/ritonavir, renal dosing, (PAXLOVID , 150/100,) 10 x 150 MG & 10 x 100MG  TBPK  2 times daily        08/31/24 1342             Note:  This document was prepared using Dragon voice recognition software and may include unintentional dictation errors.   Willo Dunnings, MD 08/31/24 1346  "

## 2024-08-31 NOTE — ED Triage Notes (Signed)
 Pt to ED via [facility] from home for N/V, dizziness and headache starting at 3am. EMS reports pt is tachycardic. Pt on 2L due to pt desatting on RA to mid 70s. EMS reports oral temp 101.

## 2024-08-31 NOTE — ED Notes (Signed)
 Pt verbalized understanding of discharge instructions. Opportunity for questions provided.

## 2024-09-01 LAB — BLOOD CULTURE ID PANEL (REFLEXED) - BCID2

## 2024-09-03 LAB — CULTURE, BLOOD (ROUTINE X 2)

## 2024-09-05 LAB — CULTURE, BLOOD (ROUTINE X 2): Culture: NO GROWTH

## 2024-09-06 ENCOUNTER — Inpatient Hospital Stay

## 2024-09-06 ENCOUNTER — Telehealth: Payer: Self-pay | Admitting: Internal Medicine

## 2024-09-06 NOTE — Telephone Encounter (Signed)
 Pt called to r/s B12 inj for today - said he is sick - r/s appt to 2/10 - pt confirmed new date/time - LH

## 2024-09-13 ENCOUNTER — Inpatient Hospital Stay

## 2024-09-13 DIAGNOSIS — E538 Deficiency of other specified B group vitamins: Secondary | ICD-10-CM

## 2024-09-13 MED ORDER — CYANOCOBALAMIN 1000 MCG/ML IJ SOLN
1000.0000 ug | Freq: Once | INTRAMUSCULAR | Status: AC
  Administered 2024-09-13: 1000 ug via INTRAMUSCULAR
  Filled 2024-09-13: qty 1

## 2024-09-20 ENCOUNTER — Inpatient Hospital Stay

## 2024-10-11 ENCOUNTER — Inpatient Hospital Stay

## 2024-10-11 ENCOUNTER — Inpatient Hospital Stay: Admitting: Internal Medicine
